# Patient Record
Sex: Female | Born: 1945 | Race: White | Hispanic: No | Smoking: Never smoker
Health system: Southern US, Community
[De-identification: ages and names within clinical notes are randomized; demographics above are authoritative.]

## PROBLEM LIST (undated history)

## (undated) DIAGNOSIS — G709 Myoneural disorder, unspecified: Secondary | ICD-10-CM

## (undated) DIAGNOSIS — E7800 Pure hypercholesterolemia, unspecified: Secondary | ICD-10-CM

## (undated) DIAGNOSIS — R0600 Dyspnea, unspecified: Secondary | ICD-10-CM

## (undated) DIAGNOSIS — G4730 Sleep apnea, unspecified: Secondary | ICD-10-CM

## (undated) DIAGNOSIS — F419 Anxiety disorder, unspecified: Secondary | ICD-10-CM

## (undated) DIAGNOSIS — Z9889 Other specified postprocedural states: Secondary | ICD-10-CM

## (undated) DIAGNOSIS — R51 Headache: Secondary | ICD-10-CM

## (undated) DIAGNOSIS — B0229 Other postherpetic nervous system involvement: Secondary | ICD-10-CM

## (undated) DIAGNOSIS — B029 Zoster without complications: Secondary | ICD-10-CM

## (undated) DIAGNOSIS — D649 Anemia, unspecified: Secondary | ICD-10-CM

## (undated) DIAGNOSIS — R519 Headache, unspecified: Secondary | ICD-10-CM

## (undated) DIAGNOSIS — M5030 Other cervical disc degeneration, unspecified cervical region: Secondary | ICD-10-CM

## (undated) DIAGNOSIS — I10 Essential (primary) hypertension: Secondary | ICD-10-CM

## (undated) DIAGNOSIS — I2510 Atherosclerotic heart disease of native coronary artery without angina pectoris: Secondary | ICD-10-CM

## (undated) DIAGNOSIS — J329 Chronic sinusitis, unspecified: Secondary | ICD-10-CM

## (undated) DIAGNOSIS — R001 Bradycardia, unspecified: Secondary | ICD-10-CM

## (undated) DIAGNOSIS — N189 Chronic kidney disease, unspecified: Secondary | ICD-10-CM

## (undated) DIAGNOSIS — R0981 Nasal congestion: Secondary | ICD-10-CM

## (undated) DIAGNOSIS — K219 Gastro-esophageal reflux disease without esophagitis: Secondary | ICD-10-CM

## (undated) DIAGNOSIS — R112 Nausea with vomiting, unspecified: Secondary | ICD-10-CM

## (undated) DIAGNOSIS — K589 Irritable bowel syndrome without diarrhea: Secondary | ICD-10-CM

## (undated) DIAGNOSIS — T884XXA Failed or difficult intubation, initial encounter: Secondary | ICD-10-CM

## (undated) HISTORY — PX: TUBAL LIGATION: SHX77

## (undated) HISTORY — PX: CHOLECYSTECTOMY: SHX55

## (undated) HISTORY — PX: TONSILLECTOMY: SUR1361

## (undated) HISTORY — PX: UVULOPALATOPHARYNGOPLASTY: SHX827

## (undated) HISTORY — PX: ABDOMINAL HYSTERECTOMY: SHX81

## (undated) HISTORY — PX: BREAST BIOPSY: SHX20

---

## 1999-01-27 ENCOUNTER — Ambulatory Visit: Admission: RE | Admit: 1999-01-27 | Discharge: 1999-01-27 | Payer: Self-pay | Admitting: Internal Medicine

## 2004-09-18 ENCOUNTER — Ambulatory Visit: Payer: Self-pay | Admitting: Podiatry

## 2005-05-11 ENCOUNTER — Ambulatory Visit: Payer: Self-pay | Admitting: Internal Medicine

## 2005-11-11 ENCOUNTER — Ambulatory Visit: Payer: Self-pay | Admitting: Internal Medicine

## 2006-01-13 ENCOUNTER — Ambulatory Visit: Payer: Self-pay | Admitting: Internal Medicine

## 2006-05-12 ENCOUNTER — Ambulatory Visit: Payer: Self-pay | Admitting: Unknown Physician Specialty

## 2006-07-04 ENCOUNTER — Ambulatory Visit: Payer: Self-pay | Admitting: Internal Medicine

## 2006-10-03 ENCOUNTER — Ambulatory Visit: Payer: Self-pay | Admitting: Pain Medicine

## 2007-08-15 ENCOUNTER — Ambulatory Visit: Payer: Self-pay | Admitting: Internal Medicine

## 2008-01-05 ENCOUNTER — Ambulatory Visit: Payer: Self-pay | Admitting: Unknown Physician Specialty

## 2008-08-16 ENCOUNTER — Ambulatory Visit: Payer: Self-pay | Admitting: Internal Medicine

## 2009-01-14 ENCOUNTER — Ambulatory Visit: Payer: Self-pay | Admitting: Internal Medicine

## 2009-08-18 ENCOUNTER — Ambulatory Visit: Payer: Self-pay | Admitting: Internal Medicine

## 2009-12-30 ENCOUNTER — Ambulatory Visit: Payer: Self-pay | Admitting: Orthopedic Surgery

## 2010-01-06 ENCOUNTER — Ambulatory Visit: Payer: Self-pay | Admitting: Orthopedic Surgery

## 2010-04-30 ENCOUNTER — Ambulatory Visit: Payer: Self-pay | Admitting: Orthopedic Surgery

## 2010-09-02 ENCOUNTER — Ambulatory Visit: Payer: Self-pay | Admitting: Internal Medicine

## 2011-09-22 ENCOUNTER — Ambulatory Visit: Payer: Self-pay | Admitting: Internal Medicine

## 2012-02-24 ENCOUNTER — Ambulatory Visit: Payer: Self-pay | Admitting: Internal Medicine

## 2012-04-17 ENCOUNTER — Ambulatory Visit: Payer: Self-pay | Admitting: Orthopedic Surgery

## 2012-05-30 ENCOUNTER — Ambulatory Visit: Payer: Self-pay | Admitting: Orthopedic Surgery

## 2012-06-06 ENCOUNTER — Ambulatory Visit: Payer: Self-pay | Admitting: Orthopedic Surgery

## 2012-09-22 ENCOUNTER — Ambulatory Visit: Payer: Self-pay | Admitting: Internal Medicine

## 2013-06-04 ENCOUNTER — Ambulatory Visit: Payer: Self-pay | Admitting: Internal Medicine

## 2013-06-12 ENCOUNTER — Ambulatory Visit: Payer: Self-pay | Admitting: Orthopedic Surgery

## 2013-11-09 ENCOUNTER — Ambulatory Visit: Payer: Self-pay | Admitting: Internal Medicine

## 2013-11-28 ENCOUNTER — Ambulatory Visit: Payer: Self-pay | Admitting: Internal Medicine

## 2014-01-22 ENCOUNTER — Ambulatory Visit: Payer: Self-pay | Admitting: Otolaryngology

## 2014-11-25 ENCOUNTER — Other Ambulatory Visit: Payer: Self-pay | Admitting: Internal Medicine

## 2014-11-25 DIAGNOSIS — Z1231 Encounter for screening mammogram for malignant neoplasm of breast: Secondary | ICD-10-CM

## 2014-12-02 ENCOUNTER — Ambulatory Visit
Admission: RE | Admit: 2014-12-02 | Discharge: 2014-12-02 | Disposition: A | Discharge status: Home / Self Care | Payer: Managed Care, Other (non HMO) | Source: Ambulatory Visit | Attending: Internal Medicine | Admitting: Internal Medicine

## 2014-12-02 DIAGNOSIS — Z1231 Encounter for screening mammogram for malignant neoplasm of breast: Secondary | ICD-10-CM | POA: Insufficient documentation

## 2015-02-10 ENCOUNTER — Encounter: Payer: Self-pay | Admitting: *Deleted

## 2015-02-17 ENCOUNTER — Other Ambulatory Visit: Payer: Self-pay | Admitting: Orthopedic Surgery

## 2015-02-17 DIAGNOSIS — M7541 Impingement syndrome of right shoulder: Secondary | ICD-10-CM

## 2015-02-18 ENCOUNTER — Encounter: Payer: Self-pay | Admitting: Anesthesiology

## 2015-02-18 ENCOUNTER — Ambulatory Visit: Payer: Medicare Other | Admitting: Anesthesiology

## 2015-02-18 ENCOUNTER — Encounter
Admission: RE | Disposition: A | Discharge status: Home / Self Care | Payer: Self-pay | Source: Ambulatory Visit | Attending: Otolaryngology

## 2015-02-18 ENCOUNTER — Ambulatory Visit
Admission: RE | Admit: 2015-02-18 | Discharge: 2015-02-18 | Disposition: A | Discharge status: Home / Self Care | Payer: Medicare Other | Source: Ambulatory Visit | Attending: Otolaryngology | Admitting: Otolaryngology

## 2015-02-18 DIAGNOSIS — K219 Gastro-esophageal reflux disease without esophagitis: Secondary | ICD-10-CM | POA: Diagnosis not present

## 2015-02-18 DIAGNOSIS — Z8249 Family history of ischemic heart disease and other diseases of the circulatory system: Secondary | ICD-10-CM | POA: Diagnosis not present

## 2015-02-18 DIAGNOSIS — J323 Chronic sphenoidal sinusitis: Secondary | ICD-10-CM | POA: Insufficient documentation

## 2015-02-18 DIAGNOSIS — J320 Chronic maxillary sinusitis: Secondary | ICD-10-CM | POA: Diagnosis not present

## 2015-02-18 DIAGNOSIS — J339 Nasal polyp, unspecified: Secondary | ICD-10-CM | POA: Insufficient documentation

## 2015-02-18 DIAGNOSIS — R51 Headache: Secondary | ICD-10-CM | POA: Insufficient documentation

## 2015-02-18 DIAGNOSIS — J321 Chronic frontal sinusitis: Secondary | ICD-10-CM | POA: Diagnosis not present

## 2015-02-18 DIAGNOSIS — J322 Chronic ethmoidal sinusitis: Secondary | ICD-10-CM

## 2015-02-18 DIAGNOSIS — J329 Chronic sinusitis, unspecified: Secondary | ICD-10-CM | POA: Diagnosis present

## 2015-02-18 DIAGNOSIS — Z882 Allergy status to sulfonamides status: Secondary | ICD-10-CM | POA: Insufficient documentation

## 2015-02-18 DIAGNOSIS — Z79899 Other long term (current) drug therapy: Secondary | ICD-10-CM | POA: Insufficient documentation

## 2015-02-18 HISTORY — DX: Nasal congestion: R09.81

## 2015-02-18 HISTORY — PX: SPHENOIDECTOMY: SHX2421

## 2015-02-18 HISTORY — PX: MAXILLARY ANTROSTOMY: SHX2003

## 2015-02-18 HISTORY — DX: Other specified postprocedural states: Z98.890

## 2015-02-18 HISTORY — DX: Headache, unspecified: R51.9

## 2015-02-18 HISTORY — PX: FRONTAL SINUS EXPLORATION: SHX6591

## 2015-02-18 HISTORY — DX: Myoneural disorder, unspecified: G70.9

## 2015-02-18 HISTORY — DX: Gastro-esophageal reflux disease without esophagitis: K21.9

## 2015-02-18 HISTORY — PX: IMAGE GUIDED SINUS SURGERY: SHX6570

## 2015-02-18 HISTORY — DX: Headache: R51

## 2015-02-18 HISTORY — DX: Nausea with vomiting, unspecified: R11.2

## 2015-02-18 HISTORY — DX: Zoster without complications: B02.9

## 2015-02-18 HISTORY — DX: Other postherpetic nervous system involvement: B02.29

## 2015-02-18 HISTORY — DX: Sleep apnea, unspecified: G47.30

## 2015-02-18 HISTORY — PX: ETHMOIDECTOMY: SHX5197

## 2015-02-18 SURGERY — SINUS SURGERY, WITH IMAGING GUIDANCE
Anesthesia: General | Wound class: Clean Contaminated

## 2015-02-18 MED ORDER — FENTANYL CITRATE (PF) 100 MCG/2ML IJ SOLN: 25.0000 ug | INTRAMUSCULAR | Status: DC | PRN

## 2015-02-18 MED ORDER — MIDAZOLAM HCL 5 MG/5ML IJ SOLN
INTRAMUSCULAR | Status: DC | PRN
  Administered 2015-02-18: 2 mg via INTRAVENOUS

## 2015-02-18 MED ORDER — LIDOCAINE HCL (CARDIAC) 20 MG/ML IV SOLN
INTRAVENOUS | Status: DC | PRN
  Administered 2015-02-18: 50 mg via INTRAVENOUS

## 2015-02-18 MED ORDER — EPHEDRINE SULFATE 50 MG/ML IJ SOLN
INTRAMUSCULAR | Status: DC | PRN
  Administered 2015-02-18: 10 mg via INTRAVENOUS

## 2015-02-18 MED ORDER — CEFPROZIL 500 MG PO TABS: 500.0000 mg | ORAL_TABLET | Freq: Two times a day (BID) | ORAL | Status: DC

## 2015-02-18 MED ORDER — ACETAMINOPHEN 10 MG/ML IV SOLN
INTRAVENOUS | Status: DC | PRN
  Administered 2015-02-18: 1000 mg via INTRAVENOUS

## 2015-02-18 MED ORDER — OXYMETAZOLINE HCL 0.05 % NA SOLN
NASAL | Status: DC | PRN
  Administered 2015-02-18: 1 via TOPICAL

## 2015-02-18 MED ORDER — GLYCOPYRROLATE 0.2 MG/ML IJ SOLN
INTRAMUSCULAR | Status: DC | PRN
  Administered 2015-02-18: 0.1 mg via INTRAVENOUS

## 2015-02-18 MED ORDER — LIDOCAINE-EPINEPHRINE 1 %-1:100000 IJ SOLN
INTRAMUSCULAR | Status: DC | PRN
  Administered 2015-02-18: 7 mL

## 2015-02-18 MED ORDER — SCOPOLAMINE 1 MG/3DAYS TD PT72
1.0000 | MEDICATED_PATCH | TRANSDERMAL | Status: DC
  Administered 2015-02-18: 1.5 mg via TRANSDERMAL

## 2015-02-18 MED ORDER — HYDROCODONE-ACETAMINOPHEN 5-325 MG PO TABS: 1.0000 | ORAL_TABLET | ORAL | Status: DC | PRN

## 2015-02-18 MED ORDER — ROCURONIUM BROMIDE 100 MG/10ML IV SOLN
INTRAVENOUS | Status: DC | PRN
  Administered 2015-02-18: 20 mg via INTRAVENOUS

## 2015-02-18 MED ORDER — PROPOFOL 10 MG/ML IV BOLUS
INTRAVENOUS | Status: DC | PRN
  Administered 2015-02-18: 120 mg via INTRAVENOUS

## 2015-02-18 MED ORDER — PREDNISONE 10 MG (48) PO TBPK: ORAL_TABLET | ORAL | Status: DC

## 2015-02-18 MED ORDER — ONDANSETRON HCL 4 MG/2ML IJ SOLN
INTRAMUSCULAR | Status: DC | PRN
  Administered 2015-02-18: 4 mg via INTRAVENOUS

## 2015-02-18 MED ORDER — FENTANYL CITRATE (PF) 100 MCG/2ML IJ SOLN
INTRAMUSCULAR | Status: DC | PRN
  Administered 2015-02-18: 25 ug via INTRAVENOUS
  Administered 2015-02-18: 100 ug via INTRAVENOUS
  Administered 2015-02-18 (×3): 25 ug via INTRAVENOUS

## 2015-02-18 MED ORDER — PROMETHAZINE HCL 25 MG/ML IJ SOLN
6.2500 mg | INTRAMUSCULAR | Status: DC | PRN
  Administered 2015-02-18: 6.25 mg via INTRAVENOUS

## 2015-02-18 MED ORDER — LACTATED RINGERS IV SOLN
INTRAVENOUS | Status: DC
  Administered 2015-02-18 (×2): via INTRAVENOUS

## 2015-02-18 MED ORDER — DEXAMETHASONE SODIUM PHOSPHATE 4 MG/ML IJ SOLN
INTRAMUSCULAR | Status: DC | PRN
  Administered 2015-02-18: 10 mg via INTRAVENOUS

## 2015-02-18 MED ORDER — HYDROCODONE-ACETAMINOPHEN 7.5-325 MG PO TABS: 1.0000 | ORAL_TABLET | Freq: Once | ORAL | Status: DC | PRN

## 2015-02-18 SURGICAL SUPPLY — 28 items
APL SRG 38 LTWT LNG FL B (MISCELLANEOUS) ×3
APPLICATOR ARISTA FLEXITIP XL (MISCELLANEOUS) ×2 IMPLANT
BATTERY INSTRU NAVIGATION (MISCELLANEOUS) ×20 IMPLANT
BLADE IRRIGATOR 40D CVD (IRRIGATION / IRRIGATOR) IMPLANT
BTRY SRG DRVR LF (MISCELLANEOUS) ×12
CANISTER SUCT 1200ML W/VALVE (MISCELLANEOUS) ×5 IMPLANT
COAG SUCT 10F 3.5MM HAND CTRL (MISCELLANEOUS) ×5 IMPLANT
DEVICE INFLATION SEID (MISCELLANEOUS) IMPLANT
DRAPE HEAD BAR (DRAPES) ×5 IMPLANT
DRESSING NASL FOAM PST OP SINU (MISCELLANEOUS) IMPLANT
DRSG NASAL 4CM NASOPORE (MISCELLANEOUS) IMPLANT
DRSG NASAL FOAM POST OP SINU (MISCELLANEOUS) ×10
GLOVE BIO SURGEON STRL SZ7.5 (GLOVE) ×10 IMPLANT
IRRIGATOR 4MM STR (IRRIGATION / IRRIGATOR) ×5 IMPLANT
IV NS 500ML (IV SOLUTION) ×5
IV NS 500ML BAXH (IV SOLUTION) ×3 IMPLANT
NAVIGATION MASK REG  ST (MISCELLANEOUS) ×5 IMPLANT
NS IRRIG 500ML POUR BTL (IV SOLUTION) ×5 IMPLANT
PACK DRAPE NASAL/ENT (PACKS) ×5 IMPLANT
PACKING NASAL EPIS 4X2.4 XEROG (MISCELLANEOUS) IMPLANT
PAD GROUND ADULT SPLIT (MISCELLANEOUS) ×5 IMPLANT
PATTIES SURGICAL .5 X3 (DISPOSABLE) ×5 IMPLANT
SET HANDPIECE IRR DIEGO (MISCELLANEOUS) ×5 IMPLANT
SOL ANTI-FOG 6CC FOG-OUT (MISCELLANEOUS) ×3 IMPLANT
SOL FOG-OUT ANTI-FOG 6CC (MISCELLANEOUS) ×2
SUT CHROMIC 4 0 SH 27 (SUTURE) ×2 IMPLANT
SYRINGE 10CC LL (SYRINGE) ×5 IMPLANT
WATER STERILE IRR 500ML POUR (IV SOLUTION) ×2 IMPLANT

## 2015-02-18 NOTE — H&P (Signed)
History and physical reviewed and will be scanned in later. No change in medical status reported by the patient or family, appears stable for surgery. All questions regarding the procedure answered, and patient (or family if a child) expressed understanding of the procedure.  Beth Beck S @TODAY@ 

## 2015-02-18 NOTE — Anesthesia Postprocedure Evaluation (Signed)
  Anesthesia Post-op Note  Patient: Beth Beck  Procedure(s) Performed: Procedure(s) with comments: IMAGE GUIDED SINUS SURGERY (N/A) - GAVE DISK TO CE CE REVISION BILATERAL SPHENOIDECTOMY (Bilateral) BILATERAL TOTAL ETHMOIDECTOMY (Bilateral) BILATERAL FRONTAL RECESS SINUS EXPLORATION (Bilateral) MAXILLARY ANTROSTOMY right side with tissue removal.  Anesthesia type:General ETT  Patient location: PACU  Post pain: Pain level controlled  Post assessment: Post-op Vital signs reviewed, Patient's Cardiovascular Status Stable, Respiratory Function Stable, Patent Airway and No signs of Nausea or vomiting  Post vital signs: Reviewed and stable  Last Vitals:  Filed Vitals:   02/18/15 1445  BP: 162/74  Pulse: 74  Temp:   Resp: 15    Level of consciousness: awake, alert  and patient cooperative  Complications: No apparent anesthesia complications

## 2015-02-18 NOTE — Discharge Instructions (Signed)
[facility] °[facility] °ENDOSCOPIC SINUS SURGERY °[facility] ° °What is Functional Endoscopic Sinus Surgery? ° The Surgery involves making the natural openings of the sinuses larger by removing the bony partitions that separate the sinuses from the nasal cavity.  The natural sinus lining is preserved as much as possible to allow the sinuses to resume normal function after the surgery.  In some patients nasal polyps (excessively swollen lining of the sinuses) may be removed to relieve obstruction of the sinus openings.  The surgery is performed through the nose using lighted scopes, which eliminates the need for incisions on the face.  A septoplasty is a different procedure which is sometimes performed with sinus surgery.  It involves straightening the boy partition that separates the two sides of your nose.  A crooked or deviated septum may need repair if is obstructing the sinuses or nasal airflow.  Turbinate reduction is also often performed during sinus surgery.  The turbinates are bony proturberances from the side walls of the nose which swell and can obstruct the nose in patients with sinus and allergy problems.  Their size can be surgically reduced to help relieve nasal obstruction. ° °What Can Sinus Surgery Do For Me? ° Sinus surgery can reduce the frequency of sinus infections requiring antibiotic treatment.  This can provide improvement in nasal congestion, post-nasal drainage, facial pressure and nasal obstruction.  Surgery will NOT prevent you from ever having an infection again, so it usually only for patients who get infections 4 or more times yearly requiring antibiotics, or for infections that do not clear with antibiotics.  It will not cure nasal allergies, so patients with allergies may still require medication to treat their allergies after surgery. Surgery may improve headaches related to sinusitis, however, some people will continue to  require medication to control sinus headaches related to allergies.  Surgery will do nothing for other forms of headache (migraine, tension or cluster). ° °What Are the Risks of Endoscopic Sinus Surgery? ° Current techniques allow surgery to be performed safely with little risk, however, there are rare complications that patients should be aware of.  Because the sinuses are located around the eyes, there is risk of eye injury, including blindness, though again, this would be quite rare. This is usually a result of bleeding behind the eye during surgery, which puts the vision oat risk, though there are treatments to protect the vision and prevent permanent disrupted by surgery causing a leak of the spinal fluid that surrounds the brain.  More serious complications would include bleeding inside the brain cavity or damage to the brain.  Again, all of these complications are uncommon, and spinal fluid leaks can be safely managed surgically if they occur.  The most common complication of sinus surgery is bleeding from the nose, which may require packing or cauterization of the nose.  Continued sinus have polyps may experience recurrence of the polyps requiring revision surgery.  Alterations of sense of smell or injury to the tear ducts are also rare complications.  ° °What is the Surgery Like, and what is the Recovery? ° The Surgery usually takes a couple of hours to perform, and is usually performed under a general anesthetic (completely asleep).  Patients are usually discharged home after a couple of hours.  Sometimes during surgery it is necessary to pack the nose to control bleeding, and the packing is left in place for 24 - 48 hours, and removed by your surgeon.    If a septoplasty was performed during the procedure, there is often a splint placed which must be removed after 5-7 days.   °Discomfort: Pain is usually mild to moderate, and can be controlled by prescription pain medication or acetaminophen (Tylenol).   Aspirin, Ibuprofen (Advil, Motrin), or Naprosyn (Aleve) should be avoided, as they can cause increased bleeding.  Most patients feel sinus pressure like they have a bad head cold for several days.  Sleeping with your head elevated can help reduce swelling and facial pressure, as can ice packs over the face.  A humidifier may be helpful to keep the mucous and blood from drying in the nose.  ° °Diet: There are no specific diet restrictions, however, you should generally start with clear liquids and a light diet of bland foods because the anesthetic can cause some nausea.  Advance your diet depending on how your stomach feels.  Taking your pain medication with food will often help reduce stomach upset which pain medications can cause. ° °Nasal Saline Irrigation: It is important to remove blood clots and dried mucous from the nose as it is healing.  This is done by having you irrigate the nose at least 3 - 4 times daily with a salt water solution.  We recommend using NeilMed Sinus Rinse (available at the drug store).  Fill the squeeze bottle with the solution, bend over a sink, and insert the tip of the squeeze bottle into the nose ½ of an inch.  Point the tip of the squeeze bottle towards the inside corner of the eye on the same side your irrigating.  Squeeze the bottle and gently irrigate the nose.  If you bend forward as you do this, most of the fluid will flow back out of the nose, instead of down your throat.   The solution should be warm, near body temperature, when you irrigate.   Each time you irrigate, you should use a full squeeze bottle.  ° °Note that if you are instructed to use Nasal Steroid Sprays at any time after your surgery, irrigate with saline BEFORE using the steroid spray, so you do not wash it all out of the nose. °Another product, Nasal Saline Gel (such as AYR Nasal Saline Gel) can be applied in each nostril 3 - 4 times daily to moisture the nose and reduce scabbing or crusting. ° °Bleeding:   Bloody drainage from the nose can be expected for several days, and patients are instructed to irrigate their nose frequently with salt water to help remove mucous and blood clots.  The drainage may be dark red or brown, though some fresh blood may be seen intermittently, especially after irrigation.  Do not blow you nose, as bleeding may occur. If you must sneeze, keep your mouth open to allow air to escape through your mouth. ° °If heavy bleeding occurs: Irrigate the nose with saline to rinse out clots, then spray the nose 3 - 4 times with Afrin Nasal Decongestant Spray.  The spray will constrict the blood vessels to slow bleeding.  Pinch the lower half of your nose shut to apply pressure, and lay down with your head elevated.  Ice packs over the nose may help as well. If bleeding persists despite these measures, you should notify your doctor.  Do not use the Afrin routinely to control nasal congestion after surgery, as it can result in worsening congestion and may affect healing.  ° ° ° °Activity: Return to work varies among patients. Most patients will be   out of work at least 5 - 7 days to recover.  Patient may return to work after they are off of narcotic pain medication, and feeling well enough to perform the functions of their job.  Patients must avoid heavy lifting (over 10 pounds) or strenuous physical for 2 weeks after surgery, so your employer may need to assign you to light duty, or keep you out of work longer if light duty is not possible.  NOTE: you should not drive, operate dangerous machinery, do any mentally demanding tasks or make any important legal or financial decisions while on narcotic pain medication and recovering from the general anesthetic.  °  °Call Your Doctor Immediately if You Have Any of the Following: °1. Bleeding that you cannot control with the above measures °2. Loss of vision, double vision, bulging of the eye or black eyes. °3. Fever over 101 degrees °4. Neck stiffness with  severe headache, fever, nausea and change in mental state. °You are always encourage to call anytime with concerns, however, please call with requests for pain medication refills during office hours. ° °Office Endoscopy: During follow-up visits your doctor will remove any packing or splints that may have been placed and evaluate and clean your sinuses endoscopically.  Topical anesthetic will be used to make this as comfortable as possible, though you may want to take your pain medication prior to the visit.  How often this will need to be done varies from patient to patient.  After complete recovery from the surgery, you may need follow-up endoscopy from time to time, particularly if there is concern of recurrent infection or nasal polyps. ° °General Anesthesia, Adult, Care After °Refer to this sheet in the next few weeks. These instructions provide you with information on caring for yourself after your procedure. Your health care provider may also give you more specific instructions. Your treatment has been planned according to current medical practices, but problems sometimes occur. Call your health care provider if you have any problems or questions after your procedure. °WHAT TO EXPECT AFTER THE PROCEDURE °After the procedure, it is typical to experience: °· Sleepiness. °· Nausea and vomiting. °HOME CARE INSTRUCTIONS °· For the first 24 hours after general anesthesia: °¨ Have a responsible person with you. °¨ Do not drive a car. If you are alone, do not take public transportation. °¨ Do not drink alcohol. °¨ Do not take medicine that has not been prescribed by your health care provider. °¨ Do not sign important papers or make important decisions. °¨ You may resume a normal diet and activities as directed by your health care provider. °· Change bandages (dressings) as directed. °· If you have questions or problems that seem related to general anesthesia, call the hospital and ask for the anesthetist or  anesthesiologist on call. °SEEK MEDICAL CARE IF: °· You have nausea and vomiting that continue the day after anesthesia. °· You develop a rash. °SEEK IMMEDIATE MEDICAL CARE IF:  °· You have difficulty breathing. °· You have chest pain. °· You have any allergic problems. °  °This information is not intended to replace advice given to you by your health care provider. Make sure you discuss any questions you have with your health care provider. °  °Document Released: 07/26/2000 Document Revised: 05/10/2014 Document Reviewed: 08/18/2011 °Elsevier Interactive Patient Education ©2016 [employer] ° °

## 2015-02-18 NOTE — Op Note (Signed)
02/18/2015  1:58 PM    [place] [facility]  [id number]   Pre-Op Diagnosis:  CHRONIC SINUSITIS SPHENOIDS ETHMOIDS, MAXILLARY AND FRONTALS, NASAL POLYPS Post-op Diagnosis: SAME   Procedure:  1)  Image Guided Sinus Surgery,   2)  Right Endoscopic Maxillary Antrostomy with Tissue Removal   3)  Bilateral Frontal Sinusotomy   4)  Bilateral total ethmoidectomy   5)  Bilateral Sphenoidotomy    Surgeon:  Sandi Mealy  Anesthesia:  General endotracheal  EBL:  100cc  Complications:  None  Findings: Lateralization of the middle turbinate bilaterally, polypoid changes were noted in both ethmoid sinuses and frontal recesses. There was polypoid change in the sphenoid recess. The maxillary sinuses, sphenoid sinuses, ethmoid and frontal sinuses were all noted to contain material consistent with fungal mucin.  Procedure: After the patient was identified in holding and the benefits of the procedure were reviewed as well as the consent and risks, the patient was taken to the operating room and with the patient in a comfortable supine position,  general orotracheal anesthesia was induced without difficulty.  A proper time-out was performed.  The Stryker image guidance system was set up and calibrated in the normal fashion and felt to be acceptable.  Next 1% Xylocaine with 1:100,000 epinephrine was infiltrated into the inferior turbinates, septum, and anterior middle turbinates bilaterally.  Several minutes were allowed for this to take effect.  Cottoniod pledgets soaked in Afrin were placed into both nasal cavities and left while the patient was prepped and draped in the standard fashion. The image guided suction was calibrated and used to inspect known points in the nasal cavity to assess accuracy of the image guided system. Accuracy was felt to be good.   The left middle turbinate was medialized and  polyps were debrided from the middle meatus utilizing then debrider. some residual ethmoid air cells  were opened further and polypoid tissue was also debrided from the lateral aspect of the middle turbinate. A small amount of polypoid material was debrided from around the left maxillary antrostomy which was predominantly patent. Cultures were taken from the middle meatus where there was fungal appearing debris located. The left maxillary antrostomy was repeatedly irrigated with saline to remove thick fungal-like debris from the maxillary sinus. This required an extended period of irrigation to get all of this clear as some of it was stuck within the recesses of the maxillary sinus. Attention was turned back to the ethmoids working more posteriorly, infrequently utilizing endoscopic Stryker suction to reassess the anatomy and make sure the ethmoids were widely patent. Attention was turned medial to the middle turbinate and the sphenoid recess inspected. Polyps were debrided from this area with the microdebrider. The sphenoid was then entered inferomedially around the sphenoid os with the suction and a Kerrison then used to widely open the sphenoid os. The sphenoid was then vigorously irrigated with saline to remove thick material. Attention was then turned to the frontal sinus. Polyps were debrided from the frontal recess until the frontal os was visualized. This was actually relatively open after removal of the polyps. There was some thick debris in the medial aspect of the frontal sinus which was irrigated clear with saline.    Attention was then turned to the right side. The accuracy of the endoscopic session was reassessed and seemed to be reduced compared to the start of the case. At this point took a number of minutes to get this corrected, and a new endoscopic suction had to  be introduced, as the original suction and appeared to fall out of calibration. Once this was corrected the procedure again commenced, medializing the middle turbinate and resecting polypoid the Terrell from the middle meatus with the  microdebrider. On this side the right maxillary sinus was actually scarred down a bit and blocked with polyps. This was widely opened using a combination of microdebrider and through-cutting forceps. The sinus was then vigorously irrigated to clear debris from it. Cultures were also taken from the right middle meatus. Dissection then proceeded back into the ethmoids, opening the ethmoids further and removing polyps from the ethmoid cavities. Next attention was turned to the medial aspect of the middle turbinate and the sphenoid recess. The sphenoid os could not be identified on this side. I probed around several times and could not find an area of thin bone to safely enter the sphenoid from this approach. I returned to the lateral aspect of the middle turbinate and went into the posterior most ethmoid air cell, and reassessed the anatomy using the Stryker suction. At a safe location as medial and inferior as possible, a suction was passed through the anterior face of the sphenoid sinus and a Kerrison used to then widely open this area. The sphenoid was then vigorously irrigated with saline which took quite a bit of effort to clear all of the thick debris. Attention was then turned to the frontal recess. Curved frontal instruments and the curved frontal suction were used to dissect the frontal recess removing polyps until the region of the frontal os was identified. This was widely opened using curved frontal through cutting instruments. The frontal sinus was irrigated as well. At the completion of the procedure all of the sinuses appeared to be widely patent. A 5-0 chromic gut suture was passed through the left middle turbinate, through the septum, through the right middle turbinate, and then passed back through the septum to secure the turbinates medially to the septum so they would not lateralize.  The nose was suctioned and inspected. Stammberger absorbable sinus packing was then placed in the ethmoid cavities  bilaterally.  Bleeding was felt to be well controlled.   The patient was then returned to the anesthesiologist for awakening and taken to recovery room in good condition postoperatively.  Disposition:   PACU and d/c home  Plan: Ice, elevation, narcotic analgesia and prophylactic antibiotics and steroids. Begin sinus irrigations with saline tomorrow, irrigating 3-4 times daily. Return to the office in 7 days.  Return to work in 7-10 days, no strenuous activities for two weeks.   Sandi MealyBennett, Rolinda Impson S 02/18/2015 1:58 PM

## 2015-02-18 NOTE — Anesthesia Preprocedure Evaluation (Signed)
Anesthesia Evaluation  Patient identified by MRN, date of birth, ID band Patient awake    Reviewed: Allergy & Precautions, H&P , NPO status , Patient's Chart, lab work & pertinent test results, reviewed documented beta blocker date and time   Airway Mallampati: II  TM Distance: >3 FB Neck ROM: full    Dental no notable dental hx.    Pulmonary sleep apnea (resolved after UPPP) ,    Pulmonary exam normal breath sounds clear to auscultation       Cardiovascular Exercise Tolerance: Good negative cardio ROS   Rhythm:regular Rate:Normal     Neuro/Psych  Headaches, negative psych ROS   GI/Hepatic Neg liver ROS, GERD  ,  Endo/Other  negative endocrine ROS  Renal/GU negative Renal ROS  negative genitourinary   Musculoskeletal   Abdominal   Peds  Hematology negative hematology ROS (+)   Anesthesia Other Findings   Reproductive/Obstetrics negative OB ROS                             Anesthesia Physical Anesthesia Plan  ASA: II  Anesthesia Plan: General ETT   Post-op Pain Management:    Induction:   Airway Management Planned:   Additional Equipment:   Intra-op Plan:   Post-operative Plan:   Informed Consent: I have reviewed the patients History and Physical, chart, labs and discussed the procedure including the risks, benefits and alternatives for the proposed anesthesia with the patient or authorized representative who has indicated his/her understanding and acceptance.   Dental Advisory Given  Plan Discussed with: CRNA  Anesthesia Plan Comments:         Anesthesia Quick Evaluation

## 2015-02-18 NOTE — Anesthesia Procedure Notes (Signed)
Procedure Name: Intubation Date/Time: 02/18/2015 10:21 AM Performed by: Jimmy PicketAMYOT, Mikael Debell Pre-anesthesia Checklist: Patient identified, Emergency Drugs available, Suction available, Patient being monitored and Timeout performed Patient Re-evaluated:Patient Re-evaluated prior to inductionOxygen Delivery Method: Circle system utilized Preoxygenation: Pre-oxygenation with 100% oxygen Intubation Type: IV induction Ventilation: Mask ventilation without difficulty Laryngoscope Size: Miller and 2 Grade View: Grade II Tube type: Oral Rae Tube size: 7.0 mm Number of attempts: 1 Airway Equipment and Method: Bougie stylet Placement Confirmation: ETT inserted through vocal cords under direct vision,  positive ETCO2 and breath sounds checked- equal and bilateral Tube secured with: Tape Dental Injury: Teeth and Oropharynx as per pre-operative assessment

## 2015-02-18 NOTE — Transfer of Care (Signed)
Immediate Anesthesia Transfer of Care Note  Patient: Beth Beck  Procedure(s) Performed: Procedure(s) with comments: IMAGE GUIDED SINUS SURGERY (N/A) - GAVE DISK TO CE CE REVISION BILATERAL SPHENOIDECTOMY (Bilateral) BILATERAL TOTAL ETHMOIDECTOMY (Bilateral) BILATERAL FRONTAL RECESS SINUS EXPLORATION (Bilateral) MAXILLARY ANTROSTOMY right side with tissue removal.  Patient Location: PACU  Anesthesia Type: General ETT  Level of Consciousness: awake, alert  and patient cooperative  Airway and Oxygen Therapy: Patient Spontanous Breathing and Patient connected to supplemental oxygen  Post-op Assessment: Post-op Vital signs reviewed, Patient's Cardiovascular Status Stable, Respiratory Function Stable, Patent Airway and No signs of Nausea or vomiting  Post-op Vital Signs: Reviewed and stable  Complications: No apparent anesthesia complications

## 2015-02-19 ENCOUNTER — Encounter: Payer: Self-pay | Admitting: Otolaryngology

## 2015-02-20 LAB — SURGICAL PATHOLOGY

## 2015-02-21 ENCOUNTER — Ambulatory Visit
Admission: RE | Admit: 2015-02-21 | Discharge: 2015-02-21 | Disposition: A | Discharge status: Home / Self Care | Payer: Medicare Other | Source: Ambulatory Visit | Attending: Orthopedic Surgery | Admitting: Orthopedic Surgery

## 2015-02-21 DIAGNOSIS — M7551 Bursitis of right shoulder: Secondary | ICD-10-CM | POA: Diagnosis not present

## 2015-02-21 DIAGNOSIS — X58XXXA Exposure to other specified factors, initial encounter: Secondary | ICD-10-CM | POA: Diagnosis not present

## 2015-02-21 DIAGNOSIS — S43431A Superior glenoid labrum lesion of right shoulder, initial encounter: Secondary | ICD-10-CM | POA: Diagnosis not present

## 2015-02-21 DIAGNOSIS — M7541 Impingement syndrome of right shoulder: Secondary | ICD-10-CM | POA: Diagnosis not present

## 2015-05-08 ENCOUNTER — Encounter
Admission: RE | Admit: 2015-05-08 | Discharge: 2015-05-08 | Disposition: A | Discharge status: Home / Self Care | Payer: Medicare Other | Source: Ambulatory Visit | Attending: Surgery | Admitting: Surgery

## 2015-05-08 DIAGNOSIS — Z01812 Encounter for preprocedural laboratory examination: Secondary | ICD-10-CM | POA: Insufficient documentation

## 2015-05-08 DIAGNOSIS — Z01810 Encounter for preprocedural cardiovascular examination: Secondary | ICD-10-CM | POA: Insufficient documentation

## 2015-05-08 HISTORY — DX: Anxiety disorder, unspecified: F41.9

## 2015-05-08 HISTORY — DX: Chronic kidney disease, unspecified: N18.9

## 2015-05-08 HISTORY — DX: Irritable bowel syndrome, unspecified: K58.9

## 2015-05-08 LAB — DIFFERENTIAL
Basophils Absolute: 0 10*3/uL (ref 0–0.1)
Basophils Relative: 1 %
EOS ABS: 0.3 10*3/uL (ref 0–0.7)
EOS PCT: 6 %
LYMPHS ABS: 1.4 10*3/uL (ref 1.0–3.6)
Lymphocytes Relative: 28 %
Monocytes Absolute: 0.6 10*3/uL (ref 0.2–0.9)
Monocytes Relative: 11 %
NEUTROS PCT: 54 %
Neutro Abs: 2.8 10*3/uL (ref 1.4–6.5)

## 2015-05-08 LAB — CBC
HCT: 37.2 % (ref 35.0–47.0)
Hemoglobin: 12.2 g/dL (ref 12.0–16.0)
MCH: 28.1 pg (ref 26.0–34.0)
MCHC: 32.9 g/dL (ref 32.0–36.0)
MCV: 85.4 fL (ref 80.0–100.0)
PLATELETS: 224 10*3/uL (ref 150–440)
RBC: 4.36 MIL/uL (ref 3.80–5.20)
RDW: 14.4 % (ref 11.5–14.5)
WBC: 5.1 10*3/uL (ref 3.6–11.0)

## 2015-05-08 NOTE — Patient Instructions (Signed)
  Your procedure is scheduled on: May 15, 2015 (Thursday) Report to Day Surgery.(Medical Mall)Second Floor To find out your arrival time please call [phone] between 1PM - 3PM on May 13, 2105 (Wednesday).  Remember: Instructions that are not followed completely may result in serious medical risk, up to and including death, or upon the discretion of your surgeon and anesthesiologist your surgery may need to be rescheduled.    __x__ 1. Do not eat food or drink liquids after midnight. No gum chewing or hard candies.     ____ 2. No Alcohol for 24 hours before or after surgery.   ____ 3. Bring all medications with you on the day of surgery if instructed.    __x__ 4. Notify your doctor if there is any change in your medical condition     (cold, fever, infections).     Do not wear jewelry, make-up, hairpins, clips or nail polish.  Do not wear lotions, powders, or perfumes. You may wear deodorant.  Do not shave 48 hours prior to surgery. Men may shave face and neck.  Do not bring valuables to the hospital.    [facility] not responsible for any belongings or valuables.               Contacts, dentures or bridgework may not be worn into surgery.  Leave your suitcase in the car. After surgery it may be brought to your room.  For patients admitted to the hospital, discharge time is determined by your                treatment team.   Patients discharged the day of surgery will not be allowed to drive home.   Please read over the following fact sheets that you were given:   Surgical Site Infection Prevention   ____ Take these medicines the morning of surgery with A SIP OF WATER:    1. Nexium (Nexium at bedtime on January 11)  2.  Gabapentin  3.   4.  5.  6.  ____ Fleet Enema (as directed)   __x__ Use CHG Soap as directed  ____ Use inhalers on the day of surgery  ____ Stop metformin 2 days prior to surgery    ____ Take 1/2 of usual insulin dose the night before  surgery and none on the morning of surgery.   __x__ Stop Coumadin/Plavix/aspirin on (Patient has stopped Aspirin)  __x__ Stop Anti-inflammatories on (Stop Naproxen now) Tylenol ok to take for pain if needed)   __x__ Stop supplements until after surgery.  (Stop Black Cohosh and Probiotic now)  ____ Bring C-Pap to the hospital.

## 2015-05-15 ENCOUNTER — Encounter: Admission: RE | Payer: Self-pay | Source: Ambulatory Visit

## 2015-05-15 ENCOUNTER — Ambulatory Visit: Admission: RE | Admit: 2015-05-15 | Payer: Medicare Other | Source: Ambulatory Visit | Admitting: Surgery

## 2015-05-15 SURGERY — ARTHROSCOPY, SHOULDER WITH REPAIR, ROTATOR CUFF, OPEN
Anesthesia: Choice | Laterality: Right

## 2015-05-23 HISTORY — PX: NASAL SINUS SURGERY: SHX719

## 2015-07-22 ENCOUNTER — Encounter
Admission: RE | Admit: 2015-07-22 | Discharge: 2015-07-22 | Disposition: A | Discharge status: Home / Self Care | Payer: Medicare Other | Source: Ambulatory Visit | Attending: Surgery | Admitting: Surgery

## 2015-07-22 DIAGNOSIS — Z01812 Encounter for preprocedural laboratory examination: Secondary | ICD-10-CM | POA: Insufficient documentation

## 2015-07-22 HISTORY — DX: Anemia, unspecified: D64.9

## 2015-07-22 LAB — SURGICAL PCR SCREEN
MRSA, PCR: NEGATIVE
Staphylococcus aureus: NEGATIVE

## 2015-07-22 NOTE — Patient Instructions (Signed)
  Your procedure is scheduled on: July 29, 2015 (Tuesday) Report to Day Surgery.[facility] Mall) Second Floor To find out your arrival time please call [phone] between 1PM - 3PM on July 28, 2015 (Monday).  Remember: Instructions that are not followed completely may result in serious medical risk, up to and including death, or upon the discretion of your surgeon and anesthesiologist your surgery may need to be rescheduled.    __x__ 1. Do not eat food or drink liquids after midnight. No gum chewing or hard candies.     __x__ 2. No Alcohol for 24 hours before or after surgery.   ____ 3. Bring all medications with you on the day of surgery if instructed.    __x__ 4. Notify your doctor if there is any change in your medical condition     (cold, fever, infections).     Do not wear jewelry, make-up, hairpins, clips or nail polish.  Do not wear lotions, powders, or perfumes. You may wear deodorant.  Do not shave 48 hours prior to surgery. Men may shave face and neck.  Do not bring valuables to the hospital.    [facility] any belongings or valuables.               Contacts, dentures or bridgework may not be worn into surgery.  Leave your suitcase in the car. After surgery it may be brought to your room.  For patients admitted to the hospital, discharge time is determined by your                treatment team.   Patients discharged the day of surgery will not be allowed to drive home.   Please read over the following fact sheets that you were given:   MRSA Information and Surgical Site Infection Prevention   ____ Take these medicines the morning of surgery with A SIP OF WATER:    1. Esomeprazole (Esomeprazole at bedtime on March 27)  2. Gabapentin  3.   4.  5.  6.  ____ Fleet Enema (as directed)   ____ Use CHG Soap as directed (Has from previous pre-op visit)  ____ Use inhalers on the day of surgery  ____ Stop metformin 2 days prior to  surgery    ____ Take 1/2 of usual insulin dose the night before surgery and none on the morning of surgery.   __x__ Stop Coumadin/Plavix/aspirin on (Patient has stopped Aspirin)  __x__ Stop Anti-inflammatories on (Stop Naproxen one week prior to surgery)   __x__ Stop supplements until after surgery.  (Stop ALL supplements now)  ____ Bring C-Pap to the hospital.

## 2015-07-29 ENCOUNTER — Encounter: Payer: Self-pay | Admitting: *Deleted

## 2015-07-29 ENCOUNTER — Ambulatory Visit
Admission: RE | Admit: 2015-07-29 | Discharge: 2015-07-29 | Disposition: A | Discharge status: Home / Self Care | Payer: Medicare Other | Source: Ambulatory Visit | Attending: Surgery | Admitting: Surgery

## 2015-07-29 ENCOUNTER — Ambulatory Visit: Payer: Medicare Other | Admitting: Anesthesiology

## 2015-07-29 ENCOUNTER — Encounter
Admission: RE | Disposition: A | Discharge status: Home / Self Care | Payer: Self-pay | Source: Ambulatory Visit | Attending: Surgery

## 2015-07-29 DIAGNOSIS — Z809 Family history of malignant neoplasm, unspecified: Secondary | ICD-10-CM | POA: Insufficient documentation

## 2015-07-29 DIAGNOSIS — M7581 Other shoulder lesions, right shoulder: Secondary | ICD-10-CM | POA: Insufficient documentation

## 2015-07-29 DIAGNOSIS — Z888 Allergy status to other drugs, medicaments and biological substances status: Secondary | ICD-10-CM | POA: Insufficient documentation

## 2015-07-29 DIAGNOSIS — M94211 Chondromalacia, right shoulder: Secondary | ICD-10-CM | POA: Diagnosis not present

## 2015-07-29 DIAGNOSIS — G43909 Migraine, unspecified, not intractable, without status migrainosus: Secondary | ICD-10-CM | POA: Diagnosis not present

## 2015-07-29 DIAGNOSIS — Z8249 Family history of ischemic heart disease and other diseases of the circulatory system: Secondary | ICD-10-CM | POA: Insufficient documentation

## 2015-07-29 DIAGNOSIS — M25511 Pain in right shoulder: Secondary | ICD-10-CM | POA: Diagnosis not present

## 2015-07-29 DIAGNOSIS — K219 Gastro-esophageal reflux disease without esophagitis: Secondary | ICD-10-CM | POA: Diagnosis not present

## 2015-07-29 DIAGNOSIS — Z881 Allergy status to other antibiotic agents status: Secondary | ICD-10-CM | POA: Diagnosis not present

## 2015-07-29 DIAGNOSIS — G4730 Sleep apnea, unspecified: Secondary | ICD-10-CM | POA: Insufficient documentation

## 2015-07-29 DIAGNOSIS — B0229 Other postherpetic nervous system involvement: Secondary | ICD-10-CM | POA: Insufficient documentation

## 2015-07-29 DIAGNOSIS — Z9049 Acquired absence of other specified parts of digestive tract: Secondary | ICD-10-CM | POA: Insufficient documentation

## 2015-07-29 DIAGNOSIS — D649 Anemia, unspecified: Secondary | ICD-10-CM | POA: Insufficient documentation

## 2015-07-29 DIAGNOSIS — K589 Irritable bowel syndrome without diarrhea: Secondary | ICD-10-CM | POA: Diagnosis not present

## 2015-07-29 DIAGNOSIS — Z79899 Other long term (current) drug therapy: Secondary | ICD-10-CM | POA: Insufficient documentation

## 2015-07-29 DIAGNOSIS — M7541 Impingement syndrome of right shoulder: Secondary | ICD-10-CM | POA: Insufficient documentation

## 2015-07-29 DIAGNOSIS — Z882 Allergy status to sulfonamides status: Secondary | ICD-10-CM | POA: Diagnosis not present

## 2015-07-29 DIAGNOSIS — E785 Hyperlipidemia, unspecified: Secondary | ICD-10-CM | POA: Insufficient documentation

## 2015-07-29 DIAGNOSIS — F419 Anxiety disorder, unspecified: Secondary | ICD-10-CM | POA: Insufficient documentation

## 2015-07-29 DIAGNOSIS — Z87440 Personal history of urinary (tract) infections: Secondary | ICD-10-CM | POA: Diagnosis not present

## 2015-07-29 HISTORY — PX: SHOULDER ARTHROSCOPY: SHX128

## 2015-07-29 SURGERY — ARTHROSCOPY, SHOULDER
Anesthesia: General | Laterality: Right

## 2015-07-29 MED ORDER — ROPIVACAINE HCL 5 MG/ML IJ SOLN
INTRAMUSCULAR | Status: AC
Start: 2015-07-29 — End: 2015-07-29
  Administered 2015-07-29: 20 mL via PERINEURAL
  Filled 2015-07-29: qty 20

## 2015-07-29 MED ORDER — LACTATED RINGERS IV SOLN
INTRAVENOUS | Status: DC
  Administered 2015-07-29 (×2): via INTRAVENOUS

## 2015-07-29 MED ORDER — ONDANSETRON HCL 4 MG/2ML IJ SOLN
INTRAMUSCULAR | Status: DC | PRN
  Administered 2015-07-29: 4 mg via INTRAVENOUS

## 2015-07-29 MED ORDER — ONDANSETRON HCL 4 MG PO TABS: 4.0000 mg | ORAL_TABLET | Freq: Four times a day (QID) | ORAL | Status: DC | PRN

## 2015-07-29 MED ORDER — PROMETHAZINE HCL 25 MG/ML IJ SOLN
6.2500 mg | Freq: Once | INTRAMUSCULAR | Status: AC
  Administered 2015-07-29: 6.25 mg via INTRAVENOUS

## 2015-07-29 MED ORDER — PROMETHAZINE HCL 25 MG/ML IJ SOLN
INTRAMUSCULAR | Status: AC
  Administered 2015-07-29: 6.25 mg via INTRAVENOUS
  Filled 2015-07-29: qty 1

## 2015-07-29 MED ORDER — MIDAZOLAM HCL 2 MG/2ML IJ SOLN
INTRAMUSCULAR | Status: DC | PRN
  Administered 2015-07-29: 2 mg via INTRAVENOUS

## 2015-07-29 MED ORDER — LIDOCAINE HCL (PF) 1 % IJ SOLN
INTRAMUSCULAR | Status: AC
  Filled 2015-07-29: qty 5

## 2015-07-29 MED ORDER — MIDAZOLAM HCL 5 MG/5ML IJ SOLN
INTRAMUSCULAR | Status: AC
  Administered 2015-07-29: 1 mg via INTRAVENOUS
  Filled 2015-07-29: qty 5

## 2015-07-29 MED ORDER — BUPIVACAINE-EPINEPHRINE (PF) 0.5% -1:200000 IJ SOLN
INTRAMUSCULAR | Status: DC | PRN
  Administered 2015-07-29: 30 mL via PERINEURAL

## 2015-07-29 MED ORDER — FENTANYL CITRATE (PF) 100 MCG/2ML IJ SOLN: 50.0000 ug | Freq: Once | INTRAMUSCULAR | Status: DC

## 2015-07-29 MED ORDER — LIDOCAINE HCL (CARDIAC) 20 MG/ML IV SOLN
INTRAVENOUS | Status: DC | PRN
  Administered 2015-07-29: 40 mg via INTRAVENOUS

## 2015-07-29 MED ORDER — SCOPOLAMINE 1 MG/3DAYS TD PT72
MEDICATED_PATCH | TRANSDERMAL | Status: AC
  Filled 2015-07-29: qty 1

## 2015-07-29 MED ORDER — EPINEPHRINE HCL 1 MG/ML IJ SOLN
INTRAMUSCULAR | Status: AC
  Filled 2015-07-29: qty 1

## 2015-07-29 MED ORDER — FENTANYL CITRATE (PF) 100 MCG/2ML IJ SOLN
INTRAMUSCULAR | Status: DC | PRN
  Administered 2015-07-29: 50 ug via INTRAVENOUS

## 2015-07-29 MED ORDER — BUPIVACAINE-EPINEPHRINE (PF) 0.5% -1:200000 IJ SOLN
INTRAMUSCULAR | Status: AC
  Filled 2015-07-29: qty 30

## 2015-07-29 MED ORDER — METOCLOPRAMIDE HCL 5 MG/ML IJ SOLN: 5.0000 mg | Freq: Three times a day (TID) | INTRAMUSCULAR | Status: DC | PRN

## 2015-07-29 MED ORDER — ONDANSETRON HCL 4 MG/2ML IJ SOLN: 4.0000 mg | Freq: Four times a day (QID) | INTRAMUSCULAR | Status: DC | PRN

## 2015-07-29 MED ORDER — POTASSIUM CHLORIDE IN NACL 20-0.9 MEQ/L-% IV SOLN
INTRAVENOUS | Status: DC
  Filled 2015-07-29: qty 1000

## 2015-07-29 MED ORDER — METOCLOPRAMIDE HCL 10 MG PO TABS: 5.0000 mg | ORAL_TABLET | Freq: Three times a day (TID) | ORAL | Status: DC | PRN

## 2015-07-29 MED ORDER — ONDANSETRON HCL 4 MG/2ML IJ SOLN
INTRAMUSCULAR | Status: AC
  Filled 2015-07-29: qty 2

## 2015-07-29 MED ORDER — ROCURONIUM BROMIDE 100 MG/10ML IV SOLN
INTRAVENOUS | Status: DC | PRN
  Administered 2015-07-29: 35 mg via INTRAVENOUS

## 2015-07-29 MED ORDER — SODIUM CHLORIDE 0.9 % IJ SOLN
INTRAMUSCULAR | Status: AC
  Filled 2015-07-29: qty 10

## 2015-07-29 MED ORDER — OXYCODONE HCL 5 MG PO TABS: 5.0000 mg | ORAL_TABLET | ORAL | Status: DC | PRN

## 2015-07-29 MED ORDER — CEFAZOLIN SODIUM-DEXTROSE 2-4 GM/100ML-% IV SOLN
2.0000 g | Freq: Once | INTRAVENOUS | Status: AC
  Administered 2015-07-29: 2 g via INTRAVENOUS

## 2015-07-29 MED ORDER — MIDAZOLAM HCL 5 MG/5ML IJ SOLN
1.0000 mg | Freq: Once | INTRAMUSCULAR | Status: AC
  Administered 2015-07-29: 1 mg via INTRAVENOUS

## 2015-07-29 MED ORDER — SUGAMMADEX SODIUM 200 MG/2ML IV SOLN
INTRAVENOUS | Status: DC | PRN
  Administered 2015-07-29: 135.2 mg via INTRAVENOUS

## 2015-07-29 MED ORDER — FENTANYL CITRATE (PF) 100 MCG/2ML IJ SOLN
INTRAMUSCULAR | Status: AC
  Administered 2015-07-29: 50 ug
  Filled 2015-07-29: qty 2

## 2015-07-29 MED ORDER — FENTANYL CITRATE (PF) 100 MCG/2ML IJ SOLN: 25.0000 ug | INTRAMUSCULAR | Status: DC | PRN

## 2015-07-29 MED ORDER — ONDANSETRON HCL 4 MG/2ML IJ SOLN
4.0000 mg | Freq: Once | INTRAMUSCULAR | Status: AC | PRN
  Administered 2015-07-29: 4 mg via INTRAVENOUS

## 2015-07-29 MED ORDER — EPHEDRINE SULFATE 50 MG/ML IJ SOLN
INTRAMUSCULAR | Status: DC | PRN
  Administered 2015-07-29: 10 mg via INTRAVENOUS

## 2015-07-29 MED ORDER — EPINEPHRINE HCL 1 MG/ML IJ SOLN
INTRAMUSCULAR | Status: DC | PRN
  Administered 2015-07-29: 2 mL

## 2015-07-29 MED ORDER — SCOPOLAMINE 1 MG/3DAYS TD PT72
1.0000 | MEDICATED_PATCH | Freq: Once | TRANSDERMAL | Status: DC
  Administered 2015-07-29: 1.5 mg via TRANSDERMAL

## 2015-07-29 MED ORDER — CEFAZOLIN SODIUM-DEXTROSE 2-4 GM/100ML-% IV SOLN
INTRAVENOUS | Status: AC
  Filled 2015-07-29: qty 100

## 2015-07-29 MED ORDER — DEXAMETHASONE SODIUM PHOSPHATE 10 MG/ML IJ SOLN
INTRAMUSCULAR | Status: DC | PRN
  Administered 2015-07-29: 10 mg via INTRAVENOUS

## 2015-07-29 MED ORDER — PROPOFOL 10 MG/ML IV BOLUS
INTRAVENOUS | Status: DC | PRN
  Administered 2015-07-29: 170 mg via INTRAVENOUS

## 2015-07-29 SURGICAL SUPPLY — 55 items
BIT DRILL JUGRKNT W/NDL BIT2.9 (DRILL) ×2 IMPLANT
BLADE FULL RADIUS 3.5 (BLADE) ×1 IMPLANT
BLADE SHAVER 4.5X7 STR FR (MISCELLANEOUS) ×3 IMPLANT
BUR ACROMIONIZER 4.0 (BURR) ×3 IMPLANT
BUR BR 5.5 WIDE MOUTH (BURR) ×1 IMPLANT
CANNULA 8.5X75 THRED (CANNULA) ×1 IMPLANT
CANNULA SHAVER 8MMX76MM (CANNULA) ×3 IMPLANT
CHLORAPREP W/TINT 26ML (MISCELLANEOUS) ×6 IMPLANT
DRAPE IMP U-DRAPE 54X76 (DRAPES) ×6 IMPLANT
DRAPE SURG 17X11 SM STRL (DRAPES) ×3 IMPLANT
DRILL JUGGERKNOT W/NDL BIT 2.9 (DRILL)
DRSG OPSITE POSTOP 4X8 (GAUZE/BANDAGES/DRESSINGS) ×1 IMPLANT
ELECT REM PT RETURN 9FT ADLT (ELECTROSURGICAL) ×3
ELECTRODE REM PT RTRN 9FT ADLT (ELECTROSURGICAL) ×1 IMPLANT
GAUZE PETRO XEROFOAM 1X8 (MISCELLANEOUS) ×3 IMPLANT
GAUZE SPONGE 4X4 12PLY STRL (GAUZE/BANDAGES/DRESSINGS) ×3 IMPLANT
GLOVE BIO SURGEON STRL SZ7.5 (GLOVE) ×6 IMPLANT
GLOVE BIO SURGEON STRL SZ8 (GLOVE) ×6 IMPLANT
GLOVE BIOGEL PI IND STRL 8 (GLOVE) ×1 IMPLANT
GLOVE BIOGEL PI INDICATOR 8 (GLOVE) ×2
GLOVE INDICATOR 8.0 STRL GRN (GLOVE) ×3 IMPLANT
GOWN STRL REUS W/ TWL LRG LVL3 (GOWN DISPOSABLE) ×2 IMPLANT
GOWN STRL REUS W/ TWL XL LVL3 (GOWN DISPOSABLE) ×1 IMPLANT
GOWN STRL REUS W/TWL LRG LVL3 (GOWN DISPOSABLE) ×6
GOWN STRL REUS W/TWL XL LVL3 (GOWN DISPOSABLE) ×3
GRASPER SUT 15 45D LOW PRO (SUTURE) ×2 IMPLANT
IV LACTATED RINGER IRRG 3000ML (IV SOLUTION) ×6
IV LR IRRIG 3000ML ARTHROMATIC (IV SOLUTION) ×2 IMPLANT
MANIFOLD NEPTUNE II (INSTRUMENTS) ×3 IMPLANT
MASK FACE SPIDER DISP (MASK) ×3 IMPLANT
MAT BLUE FLOOR 46X72 FLO (MISCELLANEOUS) ×3 IMPLANT
NDL MAYO 6 CRC TAPER PT (NEEDLE) ×1 IMPLANT
NDL MAYO CATGUT SZ 2 (NEEDLE) ×1 IMPLANT
NDL MAYO CATGUT SZ4 (NEEDLE) IMPLANT
NDL MAYO CATGUT SZ4 TCR NDL (NEEDLE) ×1 IMPLANT
NDL REVERSE CUT 1/2 CRC (NEEDLE) ×1 IMPLANT
NEEDLE HYPO 22GX1.5 SAFETY (NEEDLE) ×3 IMPLANT
NEEDLE MAYO 6 CRC TAPER PT (NEEDLE) IMPLANT
NEEDLE MAYO CATGUT SZ 1.5 (NEEDLE)
NEEDLE MAYO CATGUT SZ 2 (NEEDLE) IMPLANT
NEEDLE REVERSE CUT 1/2 CRC (NEEDLE) IMPLANT
PACK ARTHROSCOPY SHOULDER (MISCELLANEOUS) ×3 IMPLANT
SLING ARM LRG DEEP (SOFTGOODS) ×1 IMPLANT
SLING ULTRA II LG (MISCELLANEOUS) ×1 IMPLANT
STAPLER SKIN PROX 35W (STAPLE) ×3 IMPLANT
STRAP SAFETY BODY (MISCELLANEOUS) ×3 IMPLANT
SUT ETHIBOND 0 MO6 C/R (SUTURE) ×3 IMPLANT
SUT PROLENE 4 0 PS 2 18 (SUTURE) ×3 IMPLANT
SUT VIC AB 2-0 CT1 27 (SUTURE) ×6
SUT VIC AB 2-0 CT1 TAPERPNT 27 (SUTURE) ×2 IMPLANT
TAPE MICROFOAM 4IN (TAPE) ×3 IMPLANT
TUBING ARTHRO INFLOW-ONLY STRL (TUBING) ×3 IMPLANT
TUBING CONNECTING 10 (TUBING) ×2 IMPLANT
TUBING CONNECTING 10' (TUBING) ×1
WAND HAND CNTRL MULTIVAC 90 (MISCELLANEOUS) ×3 IMPLANT

## 2015-07-29 NOTE — Anesthesia Postprocedure Evaluation (Signed)
Anesthesia Post Note  Patient: Beth Beck  Procedure(s) Performed: Procedure(s) (LRB):  right shoulder arthroscopy, debridement of frayed labrium, excision of retained anchor (Right)  Patient location during evaluation: PACU Anesthesia Type: General Level of consciousness: awake Pain management: pain level controlled Vital Signs Assessment: post-procedure vital signs reviewed and stable Respiratory status: spontaneous breathing Cardiovascular status: stable Anesthetic complications: no    Last Vitals:  Filed Vitals:   07/29/15 1254 07/29/15 1310  BP: 161/75 153/69  Pulse: 63 64  Temp: 35.6 C   Resp: 14 18    Last Pain:  Filed Vitals:   07/29/15 1337  PainSc: Asleep                 VAN STAVEREN,Shilpa Bushee

## 2015-07-29 NOTE — OR Nursing (Signed)
Pt to PACU for Block.

## 2015-07-29 NOTE — H&P (Signed)
Paper H&P to be scanned into permanent record. H&P reviewed. No changes. 

## 2015-07-29 NOTE — Anesthesia Procedure Notes (Addendum)
Anesthesia Regional Block:  Interscalene brachial plexus block  Pre-Anesthetic Checklist: ,, timeout performed, Correct Patient, Correct Site, Correct Laterality, Correct Procedure, Correct Position, site marked, Risks and benefits discussed, Surgical consent,  Pre-op evaluation,  At surgeon's request  Laterality: Right  Prep: alcohol swabs       Needles:  Injection technique: Single-shot  Needle Type: Stimulator Needle - 80     Needle Length: 9cm 9 cm Needle Gauge: 22 and 22 G  Needle insertion depth: 2 cm   Additional Needles:  Procedures: nerve stimulator Interscalene brachial plexus block  Nerve Stimulator or Paresthesia:  Response: yes, 0.38 mA, 1 ms, 2 cm  Additional Responses:   Narrative:  Start time: 07/29/2015 9:55 AM End time: 07/29/2015 10:05 AM Injection made incrementally with aspirations every 5 mL.  Performed by: Personally  Anesthesiologist: Elijio MilesVAN STAVEREN, GIJSBERTUS F   Procedure Name: Intubation Date/Time: 07/29/2015 11:20 AM Performed by: Henrietta HooverPOPE, Terreon Ekholm Pre-anesthesia Checklist: Patient identified, Emergency Drugs available, Suction available, Patient being monitored and Timeout performed Patient Re-evaluated:Patient Re-evaluated prior to inductionOxygen Delivery Method: Circle system utilized Preoxygenation: Pre-oxygenation with 100% oxygen Intubation Type: IV induction Ventilation: Mask ventilation with difficulty Laryngoscope Size: McGraph and 3 Grade View: Grade II Tube type: Oral Tube size: 7.0 mm Number of attempts: 1 Airway Equipment and Method: Stylet Placement Confirmation: ETT inserted through vocal cords under direct vision,  positive ETCO2 and breath sounds checked- equal and bilateral Secured at: 23 cm Tube secured with: Tape Dental Injury: Teeth and Oropharynx as per pre-operative assessment

## 2015-07-29 NOTE — Transfer of Care (Signed)
Immediate Anesthesia Transfer of Care Note  Patient: Beth Beck  Procedure(s) Performed: Procedure(s):  right shoulder arthroscopy, debridement of frayed labrium, excision of retained anchor (Right)  Patient Location: PACU  Anesthesia Type:General  Level of Consciousness: sedated  Airway & Oxygen Therapy: Patient Spontanous Breathing and Patient connected to face mask oxygen  Post-op Assessment: Report given to RN and Post -op Vital signs reviewed and stable  Post vital signs: Reviewed and stable  Last Vitals:  Filed Vitals:   07/29/15 1049 07/29/15 1100  BP: 150/69   Pulse: 53 56  Temp:    Resp: 11 11    Complications: No apparent anesthesia complications

## 2015-07-29 NOTE — Op Note (Signed)
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  Postoperative diagnosis: Impingement/tendinopathy with labral fraying and retained suture anchor, right shoulder.  Procedure: Arthroscopic labral debridement and mini-open rotator cuff repair with excision of retained loose suture anchor, right shoulder.  Anesthesia: General endotracheal with interscalene block placed preoperatively by the anesthesiologist.  Surgeon:   [name]. Jeffrey [name], MD  Assistant:   Horris LatinoLance McGhee, PA-C  Findings: As above. There was moderate fraying of the anterior labrum without glenoid detachment. The rotator cuff appeared to be in excellent condition from the articular side, including the subscapularis, supraspinatus, and after spinatus tendons. There was focal grade 1-2 chondromalacia involving the anterior portion of the humeral head near the bicipital groove. Otherwise, the articular surfaces of both the glenoid and humerus were in excellent condition.  Complications: None  Fluids:   700 cc  Estimated blood loss: 3 cc  Tourniquet time: None  Drains: None  Closure: Staples   Brief clinical note: The patient is a 70 year old female with a history of persistent painful catching of her right shoulder status post two prior shoulder procedures, including a rotator cuff repair and decompression. The patient's symptoms have progressed despite medications, activity modification, etc. The patient's history and examination are consistent with impingement/tendinopathy with a possible recurrent rotator cuff tear. An MRI scan was inconclusive for a recurrent rotator cuff tear. The patient presents at this time for arthroscopy, debridement, and repair of any torn structures if present.  Procedure: The patient underwent placement of an interscalene block by the anesthesiologist in the preoperative  holding area before she was brought into the operating room and lain in the supine position. The patient then underwent general endotracheal intubation and anesthesia before being repositioned in the beach chair position using the beach chair positioner. The right shoulder and upper extremity were prepped with ChloraPrep solution before being draped sterilely. Preoperative antibiotics were administered. A timeout was performed to confirm the proper surgical site before the expected portal sites and incision site were injected with 0.5% Sensorcaine with epinephrine. A posterior portal was created and the glenohumeral joint thoroughly inspected with the findings as described above. An anterior portal was created using an outside-in technique. The labrum and rotator cuff were further probed, again confirming the above-noted findings. The areas of labral fraying were debrided using the full-radius resector after the labrum was carefully probed and found to be intact. The ArthroCare wand was inserted and used to obtain hemostasis as well as to "anneal" the labrum anteriorly. The instruments were removed from the joint after suctioning the excess fluid.  The camera was repositioned through the posterior portal into the subacromial space. A separate lateral portal was created using an outside-in technique. The 3.5 mm full-radius resector was introduced and used to debride the adherent scar tissue in the subacromial space. The ArthroCare wand was then inserted and used to continue this debridement, as well as to obtain hemostasis. In the area of the anterolateral aspect of the rotator cuff involving the mid-insertional fibers of the supraspinatus, a prominent corkscrew-type Peek anchor was identified. After a prolonged attempt at removing this anchor arthroscopically with a grasper, it was elected to proceed with a mini open technique as the Peek material kept cracking. The instruments were then removed from the  subacromial space after suctioning the excess fluid.  An approximately 3-4 cm incision was made over the anterolateral aspect of the shoulder beginning  at the anterolateral corner of the acromion and extending distally in line with the bicipital groove. This incision was carried down through the subcutaneous tissues to expose the deltoid fascia. The raphae between the anterior and middle thirds was identified and this plane developed to provide access into the subacromial space. Additional bursal tissues were debrided sharply using Metzenbaum scissors. The prominent anchor and associated suture material were readily identified. This material was ellipsed with a #15 blade and removed. The residual rotator cuff tear was repaired using two #0 Ethibond interrupted sutures placed in a side-to-side fashion. An apparent watertight closure was obtained.  The wound was copiously irrigated with sterile saline solution before the deltoid raphae was reapproximated using 2-0 Vicryl interrupted sutures. The subcutaneous tissues were closed in two layers using 2-0 Vicryl interrupted sutures before the skin was closed using staples. The portal sites also were closed using staples. A sterile bulky dressing was applied to the shoulder before the arm was placed into a shoulder immobilizer. The patient was then awakened, extubated, and returned to the recovery room in satisfactory condition after tolerating the procedure well.

## 2015-07-29 NOTE — Anesthesia Preprocedure Evaluation (Signed)
Anesthesia Evaluation  Patient identified by MRN, date of birth, ID band Patient awake    Reviewed: Allergy & Precautions, NPO status , Patient's Chart, lab work & pertinent test results  History of Anesthesia Complications (+) PONV  Airway Mallampati: III  TM Distance: <3 FB Neck ROM: Full    Dental  (+) Teeth Intact   Pulmonary sleep apnea ,    breath sounds clear to auscultation       Cardiovascular Exercise Tolerance: Good  Rhythm:Regular Rate:Normal     Neuro/Psych Anxiety    GI/Hepatic Neg liver ROS, GERD  ,  Endo/Other  negative endocrine ROS  Renal/GU negative Renal ROS     Musculoskeletal   Abdominal Normal abdominal exam  (+)   Peds  Hematology  (+) anemia ,   Anesthesia Other Findings   Reproductive/Obstetrics                             Anesthesia Physical Anesthesia Plan  ASA: II  Anesthesia Plan: General   Post-op Pain Management:    Induction: Intravenous  Airway Management Planned: Oral ETT  Additional Equipment:   Intra-op Plan:   Post-operative Plan: Extubation in OR  Informed Consent: I have reviewed the patients History and Physical, chart, labs and discussed the procedure including the risks, benefits and alternatives for the proposed anesthesia with the patient or authorized representative who has indicated his/her understanding and acceptance.     Plan Discussed with: CRNA  Anesthesia Plan Comments:         Anesthesia Quick Evaluation

## 2015-07-29 NOTE — Discharge Instructions (Addendum)
Keep dressing dry and intact.  °May shower after dressing changed on post-op day #4 (Saturday).  °Cover staples with Band-Aids after drying off. °Apply ice frequently to shoulder. °Keep shoulder immobilizer on at all times except may remove for bathing purposes. °Follow-up in 10-14 days or as scheduled.AMBULATORY SURGERY  °DISCHARGE INSTRUCTIONS ° ° °1) The drugs that you were given will stay in your system until tomorrow so for the next 24 hours you should not: ° °A) Drive an automobile °B) Make any legal decisions °C) Drink any alcoholic beverage ° ° °2) You may resume regular meals tomorrow.  Today it is better to start with liquids and gradually work up to solid foods. ° °You may eat anything you prefer, but it is better to start with liquids, then soup and crackers, and gradually work up to solid foods. ° ° °3) Please notify your doctor immediately if you have any unusual bleeding, trouble breathing, redness and pain at the surgery site, drainage, fever, or pain not relieved by medication. ° ° ° °4) Additional Instructions: ° ° ° ° ° ° ° °Please contact your physician with any problems or Same Day Surgery at [phone], Monday through Friday 6 am to 4 pm, or [facility] [place] Main number at [phone]. °

## 2015-07-30 ENCOUNTER — Encounter: Payer: Self-pay | Admitting: Surgery

## 2015-10-24 ENCOUNTER — Other Ambulatory Visit: Payer: Self-pay | Admitting: Internal Medicine

## 2015-10-24 DIAGNOSIS — Z1231 Encounter for screening mammogram for malignant neoplasm of breast: Secondary | ICD-10-CM

## 2015-12-03 ENCOUNTER — Other Ambulatory Visit: Payer: Self-pay | Admitting: Internal Medicine

## 2015-12-03 ENCOUNTER — Ambulatory Visit
Admission: RE | Admit: 2015-12-03 | Discharge: 2015-12-03 | Disposition: A | Discharge status: Home / Self Care | Payer: Medicare Other | Source: Ambulatory Visit | Attending: Internal Medicine | Admitting: Internal Medicine

## 2015-12-03 DIAGNOSIS — Z1231 Encounter for screening mammogram for malignant neoplasm of breast: Secondary | ICD-10-CM

## 2016-04-01 ENCOUNTER — Other Ambulatory Visit: Payer: Self-pay | Admitting: Internal Medicine

## 2016-04-01 DIAGNOSIS — R05 Cough: Secondary | ICD-10-CM

## 2016-04-01 DIAGNOSIS — R0781 Pleurodynia: Secondary | ICD-10-CM

## 2016-04-01 DIAGNOSIS — R059 Cough, unspecified: Secondary | ICD-10-CM

## 2016-04-08 ENCOUNTER — Ambulatory Visit
Admission: RE | Admit: 2016-04-08 | Discharge: 2016-04-08 | Disposition: A | Discharge status: Home / Self Care | Payer: Medicare Other | Source: Ambulatory Visit | Attending: Internal Medicine | Admitting: Internal Medicine

## 2016-04-08 DIAGNOSIS — R918 Other nonspecific abnormal finding of lung field: Secondary | ICD-10-CM | POA: Insufficient documentation

## 2016-04-08 DIAGNOSIS — R05 Cough: Secondary | ICD-10-CM | POA: Insufficient documentation

## 2016-04-08 DIAGNOSIS — R059 Cough, unspecified: Secondary | ICD-10-CM

## 2016-04-08 DIAGNOSIS — R0781 Pleurodynia: Secondary | ICD-10-CM | POA: Insufficient documentation

## 2016-04-08 MED ORDER — IOPAMIDOL (ISOVUE-370) INJECTION 76%
75.0000 mL | Freq: Once | INTRAVENOUS | Status: AC | PRN
  Administered 2016-04-08: 75 mL via INTRAVENOUS

## 2016-04-15 ENCOUNTER — Encounter: Payer: Self-pay | Admitting: Internal Medicine

## 2016-04-15 ENCOUNTER — Ambulatory Visit (INDEPENDENT_AMBULATORY_CARE_PROVIDER_SITE_OTHER): Payer: Medicare Other | Admitting: Internal Medicine

## 2016-04-15 VITALS — BP 132/88 | HR 57 | Temp 97.6°F | Ht 66.0 in | Wt 145.0 lb

## 2016-04-15 DIAGNOSIS — J181 Lobar pneumonia, unspecified organism: Secondary | ICD-10-CM

## 2016-04-15 DIAGNOSIS — J189 Pneumonia, unspecified organism: Secondary | ICD-10-CM

## 2016-04-15 MED ORDER — FLUTICASONE FUROATE-VILANTEROL 200-25 MCG/INH IN AEPB: 1.0000 | INHALATION_SPRAY | Freq: Every day | RESPIRATORY_TRACT | 0 refills | Status: DC

## 2016-04-15 NOTE — Progress Notes (Addendum)
[facility] Consultation      Date: 04/15/2016,   MRN# [id number] Beth Beck [birth year] Code Status:  Code Status History    This patient does not have a recorded code status. Please follow your organizational policy for patients in this situation.     Hosp day:'@[email or web address]' @ Referring MD: '@[email or web address]' @     PCP:      AdmissionWeight: 145 lb (65.8 kg)                 CurrentWeight: 145 lb (65.8 kg) Beth Beck is a 70 y.o. old female seen in consultation for pneumonia  at the request of Dr. Doy Hutching     CHIEF COMPLAINT:   SOB and Productive cough   HISTORY OF PRESENT ILLNESS   70 yo white female seen today for abnormal CT chest Patient was told by PCP office that she has lung mass After review of CT chest, patient has RML atelectasis likely from pneumonia, no lung mass  Patient has h/o chronic sinusitis s/p ENT surgery x 4 in the past Patient states that she has had h/o MRSA sinus infection in the past Has been on many antihistamines and nasal sprays Patient has constant mucus production and nasal congestion for many years  She has had increased productive cough last 2 weeks with green tinged sputum Patient also had chest wall soreness from coughing  Patient has been given amoxacillin for 10 days and she feels much better She still has some mucus production but improved  No signs of infection at this time I have explained to patient that she may benefit from Bronchoscopy to assess airways for mucus and masses, but I anticipate NL airways with mucus plugs-Patient understands   I have exp   PAST MEDICAL HISTORY   Past Medical History:  Diagnosis Date  . Anemia   . Anxiety   . Chronic kidney disease    History Of UTI  . GERD (gastroesophageal reflux disease)   . Headache    migraines - none recently, on medicine regimen  . Irritable bowel syndrome (IBS)   . Neuromuscular disorder ([place])    Right arm weakness s/p RCR surg x2  .  PONV (postoperative nausea and vomiting)   . Post herpetic neuralgia    right rib cage. shingles approx 7 yrs ago  . Shingles    approx 7 yrs ago - right ribs  . Sinus congestion   . Sleep apnea    fixed with UPPP surgery     SURGICAL HISTORY   Past Surgical History:  Procedure Laterality Date  . ABDOMINAL HYSTERECTOMY    . BREAST BIOPSY Left 10+yrs ago   neg.  . CHOLECYSTECTOMY    . ETHMOIDECTOMY Bilateral 02/18/2015   Procedure: BILATERAL TOTAL ETHMOIDECTOMY;  Surgeon: Clyde Canterbury, MD;  Location: [facility];  Service: ENT;  Laterality: Bilateral;  . FRONTAL SINUS EXPLORATION Bilateral 02/18/2015   Procedure: BILATERAL FRONTAL RECESS SINUS EXPLORATION;  Surgeon: Clyde Canterbury, MD;  Location: [facility];  Service: ENT;  Laterality: Bilateral;  . IMAGE GUIDED SINUS SURGERY N/A 02/18/2015   Procedure: IMAGE GUIDED SINUS SURGERY;  Surgeon: Clyde Canterbury, MD;  Location: [facility];  Service: ENT;  Laterality: N/A;  GAVE DISK TO CE CE  . MAXILLARY ANTROSTOMY  02/18/2015   Procedure: MAXILLARY ANTROSTOMY right side with tissue removal.;  Surgeon: Clyde Canterbury, MD;  Location: [facility];  Service: ENT;;  . NASAL SINUS SURGERY  May 23, 2015   Dr.  Bradd Burner, [place] SHOULDER ARTHROSCOPY Right 07/29/2015   Procedure:  right shoulder arthroscopy, debridement of frayed labrium, excision of retained anchor;  Surgeon: Corky Mull, MD;  Location: [facility];  Service: Orthopedics;  Laterality: Right;  . SPHENOIDECTOMY Bilateral 02/18/2015   Procedure: REVISION BILATERAL SPHENOIDECTOMY;  Surgeon: Clyde Canterbury, MD;  Location: [facility];  Service: ENT;  Laterality: Bilateral;  . TONSILLECTOMY    . TUBAL LIGATION    . UVULOPALATOPHARYNGOPLASTY       FAMILY HISTORY   Family History  Problem Relation Age of Onset  . Aneurysm Mother   . Heart failure Father      SOCIAL HISTORY   Social History  Substance Use Topics  . Smoking status:  Never Smoker  . Smokeless tobacco: Never Used  . Alcohol use No     MEDICATIONS    Home Medication:  Current Outpatient Rx  . Order #: [id number] Class: Historical Med  . Order #: [id number] Class: Historical Med  . Order #: [id number] Class: Historical Med  . Order #: [id number] Class: Historical Med  . Order #: [id number] Class: Historical Med  . Order #: [id number] Class: Historical Med  . Order #: [id number] Class: Historical Med  . Order #: [id number] Class: Historical Med  . Order #: [id number] Class: Historical Med  . Order #: [id number] Class: Historical Med  . Order #: [id number] Class: Historical Med  . Order #: [id number] Class: Historical Med  . Order #: [id number] Class: Historical Med  . Order #: [id number] Class: Historical Med  . Order #: [id number] Class: Print  . Order #: [id number] Class: Historical Med    Current Medication:  Current Outpatient Prescriptions:  .  aspirin (BAYER ASPIRIN EC LOW DOSE) 81 MG EC tablet, Take 81 mg by mouth daily. Swallow whole., Disp: , Rfl:  .  butalbital-acetaminophen-caffeine (FIORICET) 50-325-40 MG tablet, Take 1 tablet by mouth daily. PM, Disp: , Rfl:  .  CALCIUM-MAGNESIUM-ZINC PO, Take 2 tablets by mouth every morning. One tablet at bedtime, Disp: , Rfl:  .  Cyanocobalamin (VITAMIN B-12 PO), Take 2,500 mcg by mouth daily. , Disp: , Rfl:  .  esomeprazole (NEXIUM) 40 MG capsule, Take 40 mg by mouth every morning. AM, Disp: , Rfl:  .  estrogens, conjugated, (PREMARIN) 0.625 MG tablet, Take 0.625 mg by mouth daily. Take daily for 21 days then do not take for 7 days., Disp: , Rfl:  .  fluticasone (FLONASE) 50 MCG/ACT nasal spray, Place 2 sprays into both nostrils 2 (two) times daily. , Disp: , Rfl:  .  gabapentin (NEURONTIN) 300 MG capsule, Take 600 mg by mouth 2 (two) times daily., Disp: , Rfl:  .  Hypertonic Nasal Wash (SINUS RINSE BOTTLE KIT NA), Place into the nose 2 (two) times daily., Disp: , Rfl:  .  lovastatin (MEVACOR) 40 MG tablet, Take 40 mg by  mouth at bedtime., Disp: , Rfl:  .  metoCLOPramide (REGLAN) 10 MG tablet, Take 10 mg by mouth daily. PM, Disp: , Rfl:  .  Multiple Vitamins-Minerals (CENTRUM SILVER PO), Take 1 tablet by mouth daily. , Disp: , Rfl:  .  nadolol (CORGARD) 40 MG tablet, Take 40 mg by mouth daily. PM, Disp: , Rfl:  .  naproxen (NAPROSYN) 500 MG tablet, Take 500 mg by mouth 2 (two) times daily with a meal., Disp: , Rfl:  .  oxyCODONE (ROXICODONE) 5 MG immediate release tablet, Take 1-2 tablets (5-10 mg total) by mouth every 4 (four) hours as needed for severe pain., Disp: 60 tablet, Rfl: 0 .  Probiotic  Product (PROBIOTIC DAILY) CAPS, Take 1 capsule by mouth daily., Disp: , Rfl:     ALLERGIES   Bactrim [sulfamethoxazole-trimethoprim]; Clindamycin/lincomycin; Cymbalta [duloxetine hcl]; Escitalopram; Meloxicam; Sulfa antibiotics; and Tetracyclines & related     REVIEW OF SYSTEMS   Review of Systems  Constitutional: Negative for chills, diaphoresis, fever, malaise/fatigue and weight loss.  HENT: Negative for congestion and hearing loss.   Eyes: Negative for blurred vision and double vision.  Respiratory: Positive for cough, sputum production and shortness of breath. Negative for hemoptysis and wheezing.   Cardiovascular: Negative for chest pain, palpitations and orthopnea.  Gastrointestinal: Negative for abdominal pain, heartburn, nausea and vomiting.  Genitourinary: Negative for dysuria and urgency.  Musculoskeletal: Negative for back pain, myalgias and neck pain.  Skin: Negative for rash.  Neurological: Negative for dizziness, tingling, tremors, weakness and headaches.  Endo/Heme/Allergies: Does not bruise/bleed easily.  Psychiatric/Behavioral: Negative for depression, substance abuse and suicidal ideas.  All other systems reviewed and are negative.    VS: BP 132/88 (BP Location: Left Arm, Cuff Size: Normal)   Pulse (!) 57   Temp 97.6 F (36.4 C) (Oral)   Ht '5\' 6"'  (1.676 m)   Wt 145 lb (65.8 kg)    SpO2 98%   BMI 23.40 kg/m      PHYSICAL EXAM  Physical Exam  Constitutional: She is oriented to person, place, and time. She appears well-developed and well-nourished. No distress.  HENT:  Head: Normocephalic and atraumatic.  Mouth/Throat: No oropharyngeal exudate.  Eyes: EOM are normal. Pupils are equal, round, and reactive to light. No scleral icterus.  Neck: Normal range of motion. Neck supple.  Cardiovascular: Normal rate, regular rhythm and normal heart sounds.   No murmur heard. Pulmonary/Chest: No stridor. No respiratory distress. She has no wheezes.  Abdominal: Soft. Bowel sounds are normal.  Musculoskeletal: Normal range of motion. She exhibits no edema.  Neurological: She is alert and oriented to person, place, and time. No cranial nerve deficit.  Skin: Skin is warm. She is not diaphoretic.  Psychiatric: She has a normal mood and affect.           IMAGING    Ct Angio Chest Pe W Or Wo Contrast  Result Date: 04/08/2016 CLINICAL DATA:  Persistent cough, 2-3 months. Chest pain when eating or swallowing. EXAM: CT ANGIOGRAPHY CHEST WITH CONTRAST TECHNIQUE: Multidetector CT imaging of the chest was performed using the standard protocol during bolus administration of intravenous contrast. Multiplanar CT image reconstructions and MIPs were obtained to evaluate the vascular anatomy. CONTRAST:  75 cc Isovue 370 COMPARISON:  Radiography 05/30/2012 FINDINGS: Cardiovascular: Pulmonary arterial opacification is excellent. There are no pulmonary emboli. There is aortic atherosclerosis. No dissection. No pericardial fluid. Mediastinum/Nodes: Mildly prominent superior mediastinal lymph nodes without dominant nodal enlargement. Lungs/Pleura: The left lung is clear. Right lung shows volume loss in the inferior right upper lobe and in the medial right middle lobe which could be due to atelectasis/atelectatic pneumonia. Prominent tissue in the right hilum is consistent with nodal enlargement. I  cannot rule out a hilar mass with postobstructive collapse. Imaging follow-up to clearing would be recommended. Whereas these findings could simply be due to inflammatory disease, we have not excluded potential neoplastic etiology. There is apical pleural and parenchymal scarring right more than left. Someone nodular appearance on the right could also be followed on subsequent imaging. Upper Abdomen: Normal Musculoskeletal: Normal Review of the MIP images confirms the above findings. IMPRESSION: Volume loss in the inferior right upper lobe and  medial right middle lobe. Abnormal tissue in the right hilum. Slightly prominent mediastinal nodes. These findings are indeterminate for inflammatory disease versus neoplastic disease. At the least, follow-up to clearing should be done. If the patient does not respond well to treatment, I would suggest evaluation from the standpoint of potential right pulmonary and hilar mass. Electronically Signed   By: Nelson Chimes M.D.   On: 04/08/2016 09:13    Images reviewed 04/15/2016 RML atelectasis, mucus plugs    ASSESSMENT/PLAN   70 yo white female with h/o chronic sinusitis with persistent sinus issues with signs and symptoms of pneumonia with recent productive cough with RML infiltrate and mucus with atelectasis  1.will start BREO 200 and assess resp symptoms 2.will set up for Bronchoscopy for airways exam and obtain BAL r/o MRSA and MAI   I have personally obtained a history, examined the patient, evaluated laboratory and independently reviewed imaging results, formulated the assessment and plan and placed orders.  The Patient requires high complexity decision making for assessment and support, frequent evaluation and titration of therapies, application of advanced monitoring technologies and extensive interpretation of multiple databases.    Patient/Family are satisfied with Plan of action and management. All questions answered  Corrin Parker, M.D.    [facility]  Medical Director [place] Director [facility] Cardio-Pulmonary Department   I have reviewed the above note by Dr Mortimer Fries and Agree with plan for airway examiniation  Merton Border, MD PCCM service Mobile [phone] Pager [phone] 04/20/2016

## 2016-04-15 NOTE — Patient Instructions (Signed)
START BREO 200 daily Will set up for Bronchscopy with Dr Sung AmabileSimonds

## 2016-04-20 ENCOUNTER — Encounter: Payer: Self-pay | Admitting: Pulmonary Disease

## 2016-04-20 ENCOUNTER — Encounter
Admission: RE | Disposition: A | Discharge status: Home / Self Care | Payer: Self-pay | Source: Ambulatory Visit | Attending: Pulmonary Disease

## 2016-04-20 ENCOUNTER — Ambulatory Visit
Admission: RE | Admit: 2016-04-20 | Discharge: 2016-04-20 | Disposition: A | Discharge status: Home / Self Care | Payer: Medicare Other | Source: Ambulatory Visit | Attending: Pulmonary Disease | Admitting: Pulmonary Disease

## 2016-04-20 DIAGNOSIS — Z79890 Hormone replacement therapy (postmenopausal): Secondary | ICD-10-CM | POA: Insufficient documentation

## 2016-04-20 DIAGNOSIS — Z7951 Long term (current) use of inhaled steroids: Secondary | ICD-10-CM | POA: Diagnosis not present

## 2016-04-20 DIAGNOSIS — K219 Gastro-esophageal reflux disease without esophagitis: Secondary | ICD-10-CM | POA: Diagnosis not present

## 2016-04-20 DIAGNOSIS — Z79899 Other long term (current) drug therapy: Secondary | ICD-10-CM | POA: Insufficient documentation

## 2016-04-20 DIAGNOSIS — R938 Abnormal findings on diagnostic imaging of other specified body structures: Secondary | ICD-10-CM

## 2016-04-20 DIAGNOSIS — Z7982 Long term (current) use of aspirin: Secondary | ICD-10-CM | POA: Diagnosis not present

## 2016-04-20 DIAGNOSIS — G709 Myoneural disorder, unspecified: Secondary | ICD-10-CM | POA: Diagnosis not present

## 2016-04-20 DIAGNOSIS — Z791 Long term (current) use of non-steroidal anti-inflammatories (NSAID): Secondary | ICD-10-CM | POA: Insufficient documentation

## 2016-04-20 DIAGNOSIS — R918 Other nonspecific abnormal finding of lung field: Secondary | ICD-10-CM | POA: Diagnosis present

## 2016-04-20 DIAGNOSIS — J209 Acute bronchitis, unspecified: Secondary | ICD-10-CM | POA: Diagnosis not present

## 2016-04-20 HISTORY — PX: FLEXIBLE BRONCHOSCOPY: SHX5094

## 2016-04-20 SURGERY — BRONCHOSCOPY, FLEXIBLE
Anesthesia: Moderate Sedation

## 2016-04-20 MED ORDER — PHENYLEPHRINE HCL 0.25 % NA SOLN: 1.0000 | Freq: Four times a day (QID) | NASAL | Status: DC | PRN

## 2016-04-20 MED ORDER — MIDAZOLAM HCL 2 MG/2ML IJ SOLN
INTRAMUSCULAR | Status: AC | PRN
  Administered 2016-04-20: 2 mg via INTRAVENOUS

## 2016-04-20 MED ORDER — FENTANYL CITRATE (PF) 100 MCG/2ML IJ SOLN
INTRAMUSCULAR | Status: AC
  Administered 2016-04-20: 50 ug
  Filled 2016-04-20: qty 4

## 2016-04-20 MED ORDER — BUTAMBEN-TETRACAINE-BENZOCAINE 2-2-14 % EX AERO: 1.0000 | INHALATION_SPRAY | Freq: Once | CUTANEOUS | Status: DC

## 2016-04-20 MED ORDER — LIDOCAINE HCL 2 % EX GEL: 1.0000 "application " | Freq: Once | CUTANEOUS | Status: DC

## 2016-04-20 MED ORDER — MIDAZOLAM HCL 5 MG/5ML IJ SOLN
INTRAMUSCULAR | Status: AC
  Administered 2016-04-20: 2 mg
  Filled 2016-04-20: qty 10

## 2016-04-20 NOTE — Discharge Instructions (Signed)
Flexible Bronchoscopy, Care After These instructions give you information on caring for yourself after your procedure. Your doctor may also give you more specific instructions. Call your doctor if you have any problems or questions after your procedure. Follow these instructions at home:  Do not eat or drink anything for 2 hours after your procedure. If you try to eat or drink before the medicine wears off, food or drink could go into your lungs. You could also burn yourself.  After 2 hours have passed and when you can cough and gag normally, you may eat soft food and drink liquids slowly.  The day after the test, you may eat your normal diet.  You may do your normal activities.  Keep all doctor visits. Get help right away if:  You get more and more short of breath.  You get light-headed.  You feel like you are going to pass out (faint).  You have chest pain.  You have new problems that worry you.  You cough up more than a little blood.  You cough up more blood than before. This information is not intended to replace advice given to you by your health care provider. Make sure you discuss any questions you have with your health care provider. Document Released: 02/14/2009 Document Revised: 09/25/2015 Document Reviewed: 12/22/2012 Elsevier Interactive Patient Education  2017 Elsevier Inc.  

## 2016-04-20 NOTE — Progress Notes (Signed)
Pt clinically stable post bronchoscopy, vitals stable sats stable. Dr Sung AmabileSimonds out to speak with husband and patient with questions answered. Denies complaints at this time. Taking po's wthout difficulty. Discharge instructions given with questions answered. Dr Belia HemanKasa will speak with pt regarding results later this week.

## 2016-04-20 NOTE — Procedures (Signed)
Indication:   Abn CT chest  Premedication:   Fentanyl 50 mcg Midaz 4 mg  Anesthesia: Topical to nose and throat 30 cc of 1% lidocaine used during the course of procedure  Procedure: After adequate sedation and anesthesia, the bronchoscope was introduced via the R naris and advanced into the posterior pharynx. Further anesthesia was obtained with 1% lidocaine and the scope was advanced into the trachea. Complete airway anesthesia was achieved with 1% lidocaine and a thorough airway examination was performed. This revealed the following findings:  Findings:  Upper airway - normal anatomy, cords moved symmetrically Tracheobronchial anatomy - normal anatomic arrangement Bronchial mucosa - diffuse erythema and hyperemia c/w acute bronchitis Other - no endobronchial tumors or foreign bodies. Mild to moderate mucoid secretions throughout with moderate mucopurulent secretions in RUL, anterior segment  Specimens:   Washings from RUL sent for GS, bacterial culture, AFB smear and culture, cytology  Complications: None  Post procedure evaluation:  The patient tolerated the procedure well with no major complications CXR - not indicated   Billy Fischeravid Tovia Kisner, MD PCCM service Mobile [phone] Pager [phone]  04/20/2016

## 2016-04-21 LAB — ACID FAST SMEAR (AFB, MYCOBACTERIA): Acid Fast Smear: NEGATIVE

## 2016-04-21 LAB — ACID FAST SMEAR (AFB)

## 2016-04-21 LAB — CYTOLOGY - NON PAP

## 2016-04-22 ENCOUNTER — Encounter: Payer: Self-pay | Admitting: Pulmonary Disease

## 2016-04-22 ENCOUNTER — Telehealth: Payer: Self-pay | Admitting: Pulmonary Disease

## 2016-04-22 LAB — CULTURE, BAL-QUANTITATIVE

## 2016-04-22 LAB — CULTURE, BAL-QUANTITATIVE W GRAM STAIN

## 2016-04-22 NOTE — Telephone Encounter (Signed)
Tried to call pt. But no vm is set up yet. 

## 2016-04-22 NOTE — Telephone Encounter (Signed)
Patient says Dr. Sung AmabileSimonds recently did a procedure and she has fu with kasa.  Patient was told she was going to have rx for prednisone and a spray ? Throat.  Pharmacy does not have them .  Please call patient to discuss.

## 2016-04-22 NOTE — H&P (Signed)
See Dr Clovis FredricksonKasa's note with my addendum. There were no significant additions made  Billy Fischeravid Simonds, MD PCCM service Mobile [phone] Pager [phone] 04/22/2016

## 2016-04-23 NOTE — Telephone Encounter (Signed)
lmtcb X 2 

## 2016-04-27 MED ORDER — PREDNISONE 20 MG PO TABS: 40.0000 mg | ORAL_TABLET | Freq: Every day | ORAL | 0 refills | Status: AC

## 2016-04-27 MED ORDER — LEVOFLOXACIN 500 MG PO TABS: 500.0000 mg | ORAL_TABLET | Freq: Every day | ORAL | 0 refills | Status: DC

## 2016-04-27 NOTE — Telephone Encounter (Signed)
Her BAL grew staph aureus. I have sebt Rx to her pharmacy for Levofloxacin for 7 days and prednisone for 5 days. She is already on an inhaled steroid Virgel Bouquet(Breo)  Theodoro Gristave

## 2016-04-27 NOTE — Telephone Encounter (Signed)
Spoke with pt who states procedure with on 04-20-16. Pt was told to start prednisone and a nasal spray. These teo rx were not called in. Pt is currently out of town and will not be home until Thursday.  DS please advise. Thanks.

## 2016-04-27 NOTE — Telephone Encounter (Signed)
Rx sent to preferred pharmacy. Pt aware & voiced understanding. Nothing further needed.  

## 2016-05-13 ENCOUNTER — Ambulatory Visit (INDEPENDENT_AMBULATORY_CARE_PROVIDER_SITE_OTHER): Payer: Medicare Other | Admitting: Internal Medicine

## 2016-05-13 ENCOUNTER — Encounter: Payer: Self-pay | Admitting: Internal Medicine

## 2016-05-13 VITALS — BP 130/84 | HR 56 | Wt 145.0 lb

## 2016-05-13 DIAGNOSIS — J1520 Pneumonia due to staphylococcus, unspecified: Secondary | ICD-10-CM

## 2016-05-13 NOTE — Progress Notes (Signed)
[facility] Consultation      Date: 05/13/2016,   MRN# [id number] Beth Beck 1946-01-23 Code Status:  Code Status History    This patient does not have a recorded code status. Please follow your organizational policy for patients in this situation.     Hosp day:_0 @ Referring MD: _1 @     PCP:      AdmissionWeight: 145 lb (65.8 kg)                 CurrentWeight: 145 lb (65.8 kg) Beth Beck is a 71 y.o. old female seen in consultation for pneumonia  at the request of Dr. Doy Hutching     CHIEF COMPLAINT:   Follow up pneumonia  HISTORY OF PRESENT ILLNESS   Follow up bronch 04/2016 Showed +staph aureus species and acute bronchtiis  Patient feels much better since last visit No resp issues at this time Has chronic nasal symptoms and is to see her ENT doc next month  No signs of infection at this time   I have exp   Current Medication:  Current Outpatient Prescriptions:  .  aspirin (BAYER ASPIRIN EC LOW DOSE) 81 MG EC tablet, Take 81 mg by mouth daily. Swallow whole., Disp: , Rfl:  .  aspirin-acetaminophen-caffeine (EXCEDRIN MIGRAINE) 250-250-65 MG tablet, Take 2 tablets by mouth daily as needed for headache., Disp: , Rfl:  .  butalbital-acetaminophen-caffeine (FIORICET) 50-325-40 MG tablet, Take 1 tablet by mouth every evening. , Disp: , Rfl:  .  CALCIUM-MAGNESIUM-ZINC PO, Take 2 tablets by mouth every morning. One tablet at bedtime, Disp: , Rfl:  .  Cyanocobalamin (VITAMIN B-12 PO), Take 2,500 mcg by mouth daily. , Disp: , Rfl:  .  esomeprazole (NEXIUM) 40 MG capsule, Take 40 mg by mouth every morning. , Disp: , Rfl:  .  estrogens, conjugated, (PREMARIN) 0.625 MG tablet, Take 0.625 mg by mouth every evening. , Disp: , Rfl:  .  fluticasone (FLONASE) 50 MCG/ACT nasal spray, Place 2 sprays into both nostrils 2 (two) times daily. , Disp: , Rfl:  .  fluticasone furoate-vilanterol (BREO ELLIPTA) 200-25 MCG/INH AEPB, Inhale 1 puff  into the lungs daily., Disp: 28 each, Rfl: 0 .  gabapentin (NEURONTIN) 300 MG capsule, Take 600 mg by mouth 2 (two) times daily., Disp: , Rfl:  .  Hypertonic Nasal Wash (SINUS RINSE BOTTLE KIT NA), Place into the nose 2 (two) times daily., Disp: , Rfl:  .  lovastatin (MEVACOR) 40 MG tablet, Take 40 mg by mouth at bedtime., Disp: , Rfl:  .  metoCLOPramide (REGLAN) 10 MG tablet, Take 10 mg by mouth every evening. , Disp: , Rfl:  .  Multiple Vitamins-Minerals (CENTRUM SILVER PO), Take 1 tablet by mouth daily. , Disp: , Rfl:  .  nadolol (CORGARD) 40 MG tablet, Take 40 mg by mouth every evening. , Disp: , Rfl:  .  naproxen (NAPROSYN) 500 MG tablet, Take 500 mg by mouth every evening. , Disp: , Rfl:  .  Probiotic Product (PROBIOTIC DAILY) CAPS, Take 1 capsule by mouth daily., Disp: , Rfl:     ALLERGIES   Bactrim [sulfamethoxazole-trimethoprim]; Clindamycin/lincomycin; Cymbalta [duloxetine hcl]; Escitalopram; Meloxicam; Sulfa antibiotics; and Tetracyclines & related     REVIEW OF SYSTEMS   Review of Systems  Constitutional: Negative for chills, diaphoresis, fever, malaise/fatigue and weight loss.  HENT: Negative for congestion and hearing loss.   Respiratory: Negative for cough, hemoptysis, sputum production, shortness of breath and wheezing.   Cardiovascular: Negative for chest  pain, palpitations and orthopnea.  Gastrointestinal: Negative for abdominal pain, heartburn, nausea and vomiting.  Skin: Negative for rash.  Neurological: Negative for weakness.  All other systems reviewed and are negative.    VS: BP 130/84 (BP Location: Left Arm, Cuff Size: Normal)   Pulse (!) 56   Wt 145 lb (65.8 kg)   SpO2 98%   BMI 23.40 kg/m      PHYSICAL EXAM  Physical Exam  Constitutional: She is oriented to person, place, and time. She appears well-developed and well-nourished. No distress.  Cardiovascular: Normal rate, regular rhythm and normal heart sounds.   No murmur heard. Pulmonary/Chest:  Effort normal and breath sounds normal. No stridor. No respiratory distress. She has no wheezes.  Musculoskeletal: Normal range of motion. She exhibits no edema.  Neurological: She is alert and oriented to person, place, and time. No cranial nerve deficit.  Skin: Skin is warm. She is not diaphoretic.  Psychiatric: She has a normal mood and affect.      ASSESSMENT/PLAN   71 yo white female with h/o chronic sinusitis with persistent sinus issues with signs and symptoms of pneumonia offically dx with Staph Pneumonia   No need for inhalers at this time Follow up with ENT next month  Follow up in 3 months  I have personally obtained a history, examined the patient, evaluated laboratory and independently reviewed imaging results, formulated the assessment and plan and placed orders.  The Patient requires high complexity decision making for assessment and support, frequent evaluation and titration of therapies, application of advanced monitoring technologies and extensive interpretation of multiple databases.    Patient  satisfied with Plan of action and management. All questions answered  Beth Beck, M.D.  [facility]  Medical Director [place] Director [facility] Cardio-Pulmonary Department

## 2016-05-13 NOTE — Patient Instructions (Signed)
Follow up in 3 months

## 2016-05-21 ENCOUNTER — Telehealth: Payer: Self-pay | Admitting: Internal Medicine

## 2016-05-21 NOTE — Telephone Encounter (Signed)
Patient was recently see by Beth Beck and now she thinks the pneumonia is back.  Patient has been coughing and has the pain in her right lung.  Please call to discuss.

## 2016-05-21 NOTE — Telephone Encounter (Signed)
Pt states on Wednesday she started feeling bad with NP cough and pin in her RT upper side. States she feels like she did when she had PNA before. Offered an [place]t for [place] in [place] today but pt denied. Scheduled pt for Monday with DR. Nothing further needed.

## 2016-05-23 NOTE — Progress Notes (Signed)
* [facility]     Assessment and Plan:  Acute bronchitis  now pleuritic chest pain.  --Cough and sputum production.   Possible pneumonia with right sided infiltrate.  --Will try course of doxycycline.   Date: 05/23/2016  MRN# [id number] Beth Beck June 15, 1945   Beth Beck is a 71 y.o. old female seen in follow up for chief complaint of  Chief Complaint  Patient presents with  . Acute Visit    sinus drainage: cough prod at times: RT side pain: chest tightness:      HPI:   The patient 71 yo white female who normally sees Dr. Mortimer Fries with h/o chronic sinusitis with persistent sinus issues with signs and symptoms of pneumonia offically dx with Staph Pneumonia-- MSSA resistant to clindamycin and erythromycin.   She recently had a bout of bronchitis, and had a course of levaquin with improvement in the course of abx. But now notes that it is coming back with some pain in her right chest and cough and mucus production which she notes is similar to her previous episode of pneumonia.  She notes the pain is present with coughing and deep breaths.   Review of CXR today shows potential rul infiltrate, but there is no previous CXR available for comparison.   Medication:   Outpatient Encounter Prescriptions as of 05/24/2016  Medication Sig  . aspirin (BAYER ASPIRIN EC LOW DOSE) 81 MG EC tablet Take 81 mg by mouth daily. Swallow whole.  Marland Kitchen aspirin-acetaminophen-caffeine (EXCEDRIN MIGRAINE) 250-250-65 MG tablet Take 2 tablets by mouth daily as needed for headache.  . butalbital-acetaminophen-caffeine (FIORICET) 50-325-40 MG tablet Take 1 tablet by mouth every evening.   Marland Kitchen CALCIUM-MAGNESIUM-ZINC PO Take 2 tablets by mouth every morning. One tablet at bedtime  . Cyanocobalamin (VITAMIN B-12 PO) Take 2,500 mcg by mouth daily.   Marland Kitchen esomeprazole (NEXIUM) 40 MG capsule Take 40 mg by mouth every morning.   . estrogens, conjugated, (PREMARIN) 0.625 MG tablet Take 0.625 mg by  mouth every evening.   . fluticasone (FLONASE) 50 MCG/ACT nasal spray Place 2 sprays into both nostrils 2 (two) times daily.   . fluticasone furoate-vilanterol (BREO ELLIPTA) 200-25 MCG/INH AEPB Inhale 1 puff into the lungs daily.  Marland Kitchen gabapentin (NEURONTIN) 300 MG capsule Take 600 mg by mouth 2 (two) times daily.  . Hypertonic Nasal Wash (SINUS RINSE BOTTLE KIT NA) Place into the nose 2 (two) times daily.  Marland Kitchen lovastatin (MEVACOR) 40 MG tablet Take 40 mg by mouth at bedtime.  . metoCLOPramide (REGLAN) 10 MG tablet Take 10 mg by mouth every evening.   . Multiple Vitamins-Minerals (CENTRUM SILVER PO) Take 1 tablet by mouth daily.   . nadolol (CORGARD) 40 MG tablet Take 40 mg by mouth every evening.   . naproxen (NAPROSYN) 500 MG tablet Take 500 mg by mouth every evening.   . Probiotic Product (PROBIOTIC DAILY) CAPS Take 1 capsule by mouth daily.   No facility-administered encounter medications on file as of 05/24/2016.      Allergies:  Bactrim [sulfamethoxazole-trimethoprim]; Clindamycin/lincomycin; Cymbalta [duloxetine hcl]; Escitalopram; Meloxicam; Sulfa antibiotics; and Tetracyclines & related  Review of Systems: Gen:  Denies  fever, sweats. HEENT: Denies blurred vision. Cvc:  No dizziness, chest pain or heaviness Resp:   Denies cough or sputum porduction. Gi: Denies swallowing difficulty, stomach pain. constipation, bowel incontinence Gu:  Denies bladder incontinence, burning urine Ext:   No Joint pain, stiffness. Skin: No skin rash, easy bruising. Endoc:  No polyuria, polydipsia.  Psych: No depression, insomnia. Other:  All other systems were reviewed and found to be negative other than what is mentioned in the HPI.   Physical Examination:   VS: BP 118/70 (BP Location: Left Arm, Cuff Size: Normal)   Pulse (!) 58   Wt 145 lb (65.8 kg)   SpO2 100%   BMI 23.40 kg/m   General Appearance: No distress  Neuro:without focal findings,  speech normal,  HEENT: PERRLA, EOM  intact. Pulmonary: normal breath sounds, No wheezing.   CardiovascularNormal S1,S2.  No m/r/g.   Abdomen: Benign, Soft, non-tender. Renal:  No costovertebral tenderness  GU:  Not performed at this time. Endoc: No evident thyromegaly, no signs of acromegaly. Skin:   warm, no rash. Extremities: normal, no cyanosis, clubbing.   LABORATORY PANEL:   CBC No results for input(s): WBC, HGB, HCT, PLT in the last 168 hours. ------------------------------------------------------------------------------------------------------------------  Chemistries  No results for input(s): NA, K, CL, CO2, GLUCOSE, BUN, CREATININE, CALCIUM, MG, AST, ALT, ALKPHOS, BILITOT in the last 168 hours.  Invalid input(s): GFRCGP ------------------------------------------------------------------------------------------------------------------  Cardiac Enzymes No results for input(s): TROPONINI in the last 168 hours. ------------------------------------------------------------  RADIOLOGY:   No results found for this or any previous visit. No results found for this or any previous visit. ------------------------------------------------------------------------------------------------------------------  Thank  you for allowing [facility] to assist in the care of your patient. Our recommendations are noted above.  Please contact us if we can be of further service.   Marda Stalker, MD.  [facility] Office Number: [phone]  Patricia Pesa, M.D.  Vilinda Boehringer, M.D.  Merton Border, M.D  05/23/2016

## 2016-05-24 ENCOUNTER — Ambulatory Visit
Admission: RE | Admit: 2016-05-24 | Discharge: 2016-05-24 | Disposition: A | Discharge status: Home / Self Care | Payer: Medicare Other | Source: Ambulatory Visit | Attending: Internal Medicine | Admitting: Internal Medicine

## 2016-05-24 ENCOUNTER — Encounter: Payer: Self-pay | Admitting: Internal Medicine

## 2016-05-24 ENCOUNTER — Ambulatory Visit (INDEPENDENT_AMBULATORY_CARE_PROVIDER_SITE_OTHER): Payer: Medicare Other | Admitting: Internal Medicine

## 2016-05-24 ENCOUNTER — Other Ambulatory Visit: Payer: Self-pay | Admitting: *Deleted

## 2016-05-24 VITALS — BP 118/70 | HR 58 | Wt 145.0 lb

## 2016-05-24 DIAGNOSIS — R918 Other nonspecific abnormal finding of lung field: Secondary | ICD-10-CM | POA: Insufficient documentation

## 2016-05-24 DIAGNOSIS — R0600 Dyspnea, unspecified: Secondary | ICD-10-CM

## 2016-05-24 DIAGNOSIS — J181 Lobar pneumonia, unspecified organism: Secondary | ICD-10-CM

## 2016-05-24 DIAGNOSIS — J984 Other disorders of lung: Secondary | ICD-10-CM | POA: Diagnosis not present

## 2016-05-24 DIAGNOSIS — J189 Pneumonia, unspecified organism: Secondary | ICD-10-CM

## 2016-05-24 MED ORDER — DOXYCYCLINE HYCLATE 100 MG PO CAPS: 100.0000 mg | ORAL_CAPSULE | Freq: Two times a day (BID) | ORAL | 0 refills | Status: DC

## 2016-05-24 NOTE — Patient Instructions (Signed)
Doxycycline 100 mg bid for 7 days

## 2016-05-24 NOTE — Addendum Note (Signed)
Addended by: Meyer CoryAHMAD, MISTY R on: 05/24/2016 01:53 PM   Modules accepted: Orders

## 2016-05-27 ENCOUNTER — Telehealth: Payer: Self-pay | Admitting: Internal Medicine

## 2016-05-27 NOTE — Telephone Encounter (Signed)
Pt states she thinks the doxycycline is giving her a headache. States each time she takes one, her headache gets worse.

## 2016-05-28 NOTE — Telephone Encounter (Signed)
Pt was seen 1/22 for possible PNA and was given Doxycycline. States she thinks it is giving her a headache. Please advise.

## 2016-05-28 NOTE — Telephone Encounter (Signed)
HA would be an unusual adverse effect from doxycycline. Based on her bronchoscopy results from December and on her CXR from 01/22, it is the best choice of antibiotics if she can tolerate it. Would try acetaminophen as needed for HA. Call back first of next week if still a problem and we can consider changing it to something else  Thanks  Theodoro Gristave

## 2016-05-28 NOTE — Telephone Encounter (Signed)
Pt informed. Nothing further needed. 

## 2016-06-03 LAB — ACID FAST CULTURE WITH REFLEXED SENSITIVITIES: ACID FAST CULTURE - AFSCU3: NEGATIVE

## 2016-06-03 LAB — ACID FAST CULTURE WITH REFLEXED SENSITIVITIES (MYCOBACTERIA)

## 2016-08-17 ENCOUNTER — Other Ambulatory Visit: Payer: Self-pay | Admitting: Surgery

## 2016-08-17 DIAGNOSIS — M75121 Complete rotator cuff tear or rupture of right shoulder, not specified as traumatic: Secondary | ICD-10-CM

## 2016-08-17 DIAGNOSIS — M7581 Other shoulder lesions, right shoulder: Secondary | ICD-10-CM

## 2016-08-24 ENCOUNTER — Ambulatory Visit (INDEPENDENT_AMBULATORY_CARE_PROVIDER_SITE_OTHER): Payer: Medicare Other | Admitting: Internal Medicine

## 2016-08-24 ENCOUNTER — Encounter: Payer: Self-pay | Admitting: Internal Medicine

## 2016-08-24 VITALS — BP 122/86 | HR 66 | Resp 16 | Ht 66.0 in | Wt 148.0 lb

## 2016-08-24 DIAGNOSIS — J320 Chronic maxillary sinusitis: Secondary | ICD-10-CM | POA: Diagnosis not present

## 2016-08-24 MED ORDER — AZELASTINE-FLUTICASONE 137-50 MCG/ACT NA SUSP: 1.0000 | Freq: Every day | NASAL | 0 refills | Status: DC

## 2016-08-24 NOTE — Patient Instructions (Signed)
Will try Dymista

## 2016-08-24 NOTE — Progress Notes (Signed)
[facility] Consultation      Date: 08/24/2016,   MRN# [id number] RONI FRIBERG 24-Jan-1968 Code Status:  Code Status History    This patient does not have a recorded code status. Please follow your organizational policy for patients in this situation.     Hosp day:_0 @ Referring MD: _1 @     PCP:      AdmissionWeight: 148 lb (67.1 kg)                 CurrentWeight: 148 lb (67.1 kg) Beth Beck is a 71 y.o. old female seen in consultation for pneumonia  at the request of Dr. Doy Hutching   Previous Bronch bronch 04/2016 Showed +staph aureus species and acute bronchtiis  CHIEF COMPLAINT:   Follow up pneumonia  HISTORY OF PRESENT ILLNESS    Patient feels much better since last visit but has chronic sinus drainage and throuat clearing She is miserable No resp issues at this time Has chronic nasal symptoms and is to see her ENT doc next month  No signs of infection at this time   I have exp   Current Medication:  Current Outpatient Prescriptions:  .  aspirin (BAYER ASPIRIN EC LOW DOSE) 81 MG EC tablet, Take 81 mg by mouth daily. Swallow whole., Disp: , Rfl:  .  aspirin-acetaminophen-caffeine (EXCEDRIN MIGRAINE) 250-250-65 MG tablet, Take 2 tablets by mouth daily as needed for headache., Disp: , Rfl:  .  butalbital-acetaminophen-caffeine (FIORICET) 50-325-40 MG tablet, Take 1 tablet by mouth every evening. , Disp: , Rfl:  .  CALCIUM-MAGNESIUM-ZINC PO, Take 2 tablets by mouth every morning. One tablet at bedtime, Disp: , Rfl:  .  Cyanocobalamin (VITAMIN B-12 PO), Take 2,500 mcg by mouth daily. , Disp: , Rfl:  .  esomeprazole (NEXIUM) 40 MG capsule, Take 40 mg by mouth every morning. , Disp: , Rfl:  .  estrogens, conjugated, (PREMARIN) 0.625 MG tablet, Take 0.625 mg by mouth every evening. , Disp: , Rfl:  .  fluticasone (FLONASE) 50 MCG/ACT nasal spray, Place 2 sprays into both nostrils 2 (two) times daily. , Disp: , Rfl:  .   gabapentin (NEURONTIN) 300 MG capsule, Take 600 mg by mouth 2 (two) times daily., Disp: , Rfl:  .  Hypertonic Nasal Wash (SINUS RINSE BOTTLE KIT NA), Place into the nose 2 (two) times daily., Disp: , Rfl:  .  lovastatin (MEVACOR) 40 MG tablet, Take 40 mg by mouth at bedtime., Disp: , Rfl:  .  metoCLOPramide (REGLAN) 10 MG tablet, Take 10 mg by mouth every evening. , Disp: , Rfl:  .  Multiple Vitamins-Minerals (CENTRUM SILVER PO), Take 1 tablet by mouth daily. , Disp: , Rfl:  .  nadolol (CORGARD) 40 MG tablet, Take 40 mg by mouth every evening. , Disp: , Rfl:  .  naproxen (NAPROSYN) 500 MG tablet, Take 500 mg by mouth every evening. , Disp: , Rfl:  .  Probiotic Product (PROBIOTIC DAILY) CAPS, Take 1 capsule by mouth daily., Disp: , Rfl:     ALLERGIES   Bactrim [sulfamethoxazole-trimethoprim]; Clindamycin/lincomycin; Cymbalta [duloxetine hcl]; Escitalopram; Meloxicam; Sulfa antibiotics; and Tetracyclines & related     REVIEW OF SYSTEMS   Review of Systems  Constitutional: Negative for chills, diaphoresis, fever, malaise/fatigue and weight loss.  HENT: Negative for congestion and hearing loss.   Respiratory: Negative for cough, hemoptysis, sputum production, shortness of breath and wheezing.   Cardiovascular: Negative for chest pain, palpitations and orthopnea.  Gastrointestinal: Negative for abdominal pain, heartburn,  nausea and vomiting.  Skin: Negative for rash.  Neurological: Negative for weakness.  All other systems reviewed and are negative.    VS: BP 122/86 (BP Location: Left Arm, Patient Position: Sitting, Cuff Size: Normal)   Pulse 66   Resp 16   Ht 5' 6" (1.676 m)   Wt 148 lb (67.1 kg)   SpO2 100%   BMI 23.89 kg/m      PHYSICAL EXAM  Physical Exam  Constitutional: She is oriented to person, place, and time. She appears well-developed and well-nourished. No distress.  Cardiovascular: Normal rate, regular rhythm and normal heart sounds.   No murmur  heard. Pulmonary/Chest: Effort normal and breath sounds normal. No stridor. No respiratory distress. She has no wheezes.  Musculoskeletal: Normal range of motion. She exhibits no edema.  Neurological: She is alert and oriented to person, place, and time. No cranial nerve deficit.  Skin: Skin is warm. She is not diaphoretic.  Psychiatric: She has a normal mood and affect.      ASSESSMENT/PLAN   71 yo white female with h/o chronic sinusitis with persistent sinus issues and drainage without signs and symptoms of pneumonia offically dx with Staph Pneumonia   No need for inhalers at this time Will try Dymista nasal sprays and hope for the best Follow up with ENT next month  Follow up in 6 months    Patient  satisfied with Plan of action and management. All questions answered  Corrin Parker, M.D.  [facility]  Medical Director [place] Director [facility] Cardio-Pulmonary Department

## 2016-08-24 NOTE — Addendum Note (Signed)
Addended by: Alease Frame on: 08/24/2016 11:50 AM   Modules accepted: Orders

## 2016-08-26 ENCOUNTER — Ambulatory Visit
Admission: RE | Admit: 2016-08-26 | Discharge: 2016-08-26 | Disposition: A | Discharge status: Home / Self Care | Payer: Medicare Other | Source: Ambulatory Visit | Attending: Surgery | Admitting: Surgery

## 2016-08-26 DIAGNOSIS — M7581 Other shoulder lesions, right shoulder: Secondary | ICD-10-CM | POA: Insufficient documentation

## 2016-08-26 DIAGNOSIS — Z9889 Other specified postprocedural states: Secondary | ICD-10-CM | POA: Diagnosis not present

## 2016-08-26 DIAGNOSIS — M75121 Complete rotator cuff tear or rupture of right shoulder, not specified as traumatic: Secondary | ICD-10-CM | POA: Diagnosis present

## 2016-08-26 DIAGNOSIS — M7551 Bursitis of right shoulder: Secondary | ICD-10-CM | POA: Insufficient documentation

## 2016-09-28 ENCOUNTER — Encounter
Admission: RE | Admit: 2016-09-28 | Discharge: 2016-09-28 | Disposition: A | Discharge status: Home / Self Care | Payer: Medicare Other | Source: Ambulatory Visit | Attending: Surgery | Admitting: Surgery

## 2016-09-28 DIAGNOSIS — M7581 Other shoulder lesions, right shoulder: Secondary | ICD-10-CM | POA: Insufficient documentation

## 2016-09-28 DIAGNOSIS — Z01810 Encounter for preprocedural cardiovascular examination: Secondary | ICD-10-CM | POA: Diagnosis not present

## 2016-09-28 DIAGNOSIS — M75121 Complete rotator cuff tear or rupture of right shoulder, not specified as traumatic: Secondary | ICD-10-CM | POA: Diagnosis not present

## 2016-09-28 DIAGNOSIS — Z01818 Encounter for other preprocedural examination: Secondary | ICD-10-CM | POA: Diagnosis present

## 2016-09-28 DIAGNOSIS — R001 Bradycardia, unspecified: Secondary | ICD-10-CM | POA: Insufficient documentation

## 2016-09-28 HISTORY — DX: Pure hypercholesterolemia, unspecified: E78.00

## 2016-09-28 LAB — SURGICAL PCR SCREEN
MRSA, PCR: NEGATIVE
Staphylococcus aureus: POSITIVE — AB

## 2016-09-28 NOTE — Patient Instructions (Signed)
Your procedure is scheduled on: October 05, 2016 (Tuesday)   Report to Same Day Surgery 2nd floor medical mall [facility] Entrance-take elevator on left to 2nd floor.  Check in with surgery information desk.) To find out your arrival time please call [phone] between 1PM - 3PM on October 04, 2016 (Monday)   Remember: Instructions that are not followed completely may result in serious medical risk, up to and including death, or upon the discretion of your surgeon and anesthesiologist your surgery may need to be rescheduled.    _x___ 1. Do not eat food or drink liquids after midnight. No gum chewing or hard candies                               __x__ 2. No Alcohol for 24 hours before or after surgery.   __x__3. No Smoking for 24 prior to surgery.   ____  4. Bring all medications with you on the day of surgery if instructed.    __x__ 5. Notify your doctor if there is any change in your medical condition     (cold, fever, infections).     Do not wear jewelry, make-up, hairpins, clips or nail polish.  Do not wear lotions, powders, or perfumes. You may wear deodorant.  Do not shave 48 hours prior to surgery. Men may shave face and neck.  Do not bring valuables to the hospital.    [facility] is not responsible for any belongings or valuables.               Contacts, dentures or bridgework may not be worn into surgery.  Leave your suitcase in the car. After surgery it may be brought to your room.  For patients admitted to the hospital, discharge time is determined by your treatment team                         Patients discharged the day of surgery will not be allowed to drive home.  You will need someone to drive you home and stay with you the night of your procedure.    Please read over the following fact sheets that you were given:   [facility] Preparing for Surgery and or MRSA Information   _x___ Take the following medications the morning of surgery with a sip of water: 1. Nexium   (Nexium at bedtime on June 4 ) 2. Gabapentin  ____Fleets enema or Magnesium Citrate as directed.   _x___ Use CHG Soap or sage wipes as directed on instruction sheet   ____ Use inhalers on the day of surgery and bring to hospital day of surgery  ____ Stop Metformin and Janumet 2 days prior to surgery.    ____ Take 1/2 of usual insulin dose the night before surgery and none on the morning surgery  _x___ Follow recommendations from Cardiologist, Pulmonologist or PCP regarding          stopping Aspirin, Coumadin, Pllavix ,Eliquis, Effient, or Pradaxa, and Pletal. (STOP ASPIRIN TODAY)  X____Stop Anti-inflammatories such as Advil, Aleve, Ibuprofen, Motrin, Naproxen, Naprosyn, Goodies powders or aspirin products. OK to take Tylenol and     (STOP EXCEDRIN AND NAPROXEN TODAY )                      _x___ Stop supplements until after surgery.  But may continue Vitamin D, Vitamin B,  and  multivitamin      ____ Bring C-Pap to the hospital.

## 2016-10-05 ENCOUNTER — Ambulatory Visit: Payer: Medicare Other | Admitting: Anesthesiology

## 2016-10-05 ENCOUNTER — Encounter
Admission: RE | Disposition: A | Discharge status: Home / Self Care | Payer: Self-pay | Source: Ambulatory Visit | Attending: Surgery

## 2016-10-05 ENCOUNTER — Encounter: Payer: Self-pay | Admitting: Anesthesiology

## 2016-10-05 ENCOUNTER — Ambulatory Visit
Admission: RE | Admit: 2016-10-05 | Discharge: 2016-10-05 | Disposition: A | Discharge status: Home / Self Care | Payer: Medicare Other | Source: Ambulatory Visit | Attending: Surgery | Admitting: Surgery

## 2016-10-05 DIAGNOSIS — K219 Gastro-esophageal reflux disease without esophagitis: Secondary | ICD-10-CM | POA: Insufficient documentation

## 2016-10-05 DIAGNOSIS — G4730 Sleep apnea, unspecified: Secondary | ICD-10-CM | POA: Diagnosis not present

## 2016-10-05 DIAGNOSIS — Z7952 Long term (current) use of systemic steroids: Secondary | ICD-10-CM | POA: Diagnosis not present

## 2016-10-05 DIAGNOSIS — Z79899 Other long term (current) drug therapy: Secondary | ICD-10-CM | POA: Insufficient documentation

## 2016-10-05 DIAGNOSIS — E785 Hyperlipidemia, unspecified: Secondary | ICD-10-CM | POA: Insufficient documentation

## 2016-10-05 DIAGNOSIS — E7800 Pure hypercholesterolemia, unspecified: Secondary | ICD-10-CM | POA: Insufficient documentation

## 2016-10-05 DIAGNOSIS — N189 Chronic kidney disease, unspecified: Secondary | ICD-10-CM | POA: Insufficient documentation

## 2016-10-05 DIAGNOSIS — F419 Anxiety disorder, unspecified: Secondary | ICD-10-CM | POA: Diagnosis not present

## 2016-10-05 DIAGNOSIS — M7541 Impingement syndrome of right shoulder: Secondary | ICD-10-CM | POA: Insufficient documentation

## 2016-10-05 DIAGNOSIS — M795 Residual foreign body in soft tissue: Secondary | ICD-10-CM | POA: Insufficient documentation

## 2016-10-05 DIAGNOSIS — Z882 Allergy status to sulfonamides status: Secondary | ICD-10-CM | POA: Insufficient documentation

## 2016-10-05 HISTORY — PX: SHOULDER ARTHROSCOPY WITH OPEN ROTATOR CUFF REPAIR: SHX6092

## 2016-10-05 HISTORY — DX: Failed or difficult intubation, initial encounter: T88.4XXA

## 2016-10-05 SURGERY — ARTHROSCOPY, SHOULDER WITH REPAIR, ROTATOR CUFF, OPEN
Anesthesia: General | Site: Shoulder | Laterality: Right | Wound class: Clean

## 2016-10-05 MED ORDER — EPINEPHRINE PF 1 MG/ML IJ SOLN
INTRAMUSCULAR | Status: AC
  Filled 2016-10-05: qty 2

## 2016-10-05 MED ORDER — FENTANYL CITRATE (PF) 100 MCG/2ML IJ SOLN: 25.0000 ug | INTRAMUSCULAR | Status: DC | PRN

## 2016-10-05 MED ORDER — LACTATED RINGERS IV SOLN
INTRAVENOUS | Status: DC
  Administered 2016-10-05 (×2): via INTRAVENOUS

## 2016-10-05 MED ORDER — ROPIVACAINE HCL 5 MG/ML IJ SOLN
INTRAMUSCULAR | Status: DC | PRN
  Administered 2016-10-05: 20 mL via PERINEURAL
  Administered 2016-10-05: 10 mL via PERINEURAL

## 2016-10-05 MED ORDER — OXYCODONE HCL 5 MG PO TABS: 5.0000 mg | ORAL_TABLET | ORAL | 0 refills | Status: DC | PRN

## 2016-10-05 MED ORDER — LIDOCAINE HCL (PF) 1 % IJ SOLN
INTRAMUSCULAR | Status: DC | PRN
  Administered 2016-10-05: 1 mL via INTRADERMAL

## 2016-10-05 MED ORDER — ONDANSETRON HCL 4 MG/2ML IJ SOLN
INTRAMUSCULAR | Status: DC | PRN
  Administered 2016-10-05: 4 mg via INTRAVENOUS

## 2016-10-05 MED ORDER — PROPOFOL 10 MG/ML IV BOLUS
INTRAVENOUS | Status: AC
  Filled 2016-10-05: qty 20

## 2016-10-05 MED ORDER — SCOPOLAMINE 1 MG/3DAYS TD PT72
MEDICATED_PATCH | TRANSDERMAL | Status: AC
  Filled 2016-10-05: qty 1

## 2016-10-05 MED ORDER — EPHEDRINE SULFATE 50 MG/ML IJ SOLN
INTRAMUSCULAR | Status: DC | PRN
  Administered 2016-10-05: 5 mg via INTRAVENOUS
  Administered 2016-10-05: 20 mg via INTRAVENOUS

## 2016-10-05 MED ORDER — LIDOCAINE HCL (CARDIAC) 20 MG/ML IV SOLN
INTRAVENOUS | Status: DC | PRN
  Administered 2016-10-05: 100 mg via INTRAVENOUS

## 2016-10-05 MED ORDER — EPHEDRINE SULFATE 50 MG/ML IJ SOLN
INTRAMUSCULAR | Status: AC
  Filled 2016-10-05: qty 1

## 2016-10-05 MED ORDER — ROCURONIUM BROMIDE 100 MG/10ML IV SOLN
INTRAVENOUS | Status: DC | PRN
  Administered 2016-10-05: 30 mg via INTRAVENOUS

## 2016-10-05 MED ORDER — ONDANSETRON HCL 4 MG/2ML IJ SOLN
INTRAMUSCULAR | Status: AC
  Filled 2016-10-05: qty 2

## 2016-10-05 MED ORDER — LIDOCAINE HCL (PF) 1 % IJ SOLN
INTRAMUSCULAR | Status: AC
  Filled 2016-10-05: qty 5

## 2016-10-05 MED ORDER — BUPIVACAINE-EPINEPHRINE 0.5% -1:200000 IJ SOLN
INTRAMUSCULAR | Status: DC | PRN
  Administered 2016-10-05: 30 mL

## 2016-10-05 MED ORDER — MIDAZOLAM HCL 2 MG/2ML IJ SOLN
INTRAMUSCULAR | Status: AC
  Filled 2016-10-05: qty 2

## 2016-10-05 MED ORDER — OXYCODONE HCL 5 MG/5ML PO SOLN: 5.0000 mg | Freq: Once | ORAL | Status: DC | PRN

## 2016-10-05 MED ORDER — OXYCODONE HCL 5 MG PO TABS: 5.0000 mg | ORAL_TABLET | Freq: Once | ORAL | Status: DC | PRN

## 2016-10-05 MED ORDER — BUPIVACAINE-EPINEPHRINE (PF) 0.5% -1:200000 IJ SOLN
INTRAMUSCULAR | Status: AC
  Filled 2016-10-05: qty 30

## 2016-10-05 MED ORDER — PROPOFOL 10 MG/ML IV BOLUS
INTRAVENOUS | Status: DC | PRN
  Administered 2016-10-05: 120 mg via INTRAVENOUS

## 2016-10-05 MED ORDER — DIPHENHYDRAMINE HCL 50 MG/ML IJ SOLN
12.5000 mg | Freq: Once | INTRAMUSCULAR | Status: AC
  Administered 2016-10-05: 12.5 mg via INTRAVENOUS

## 2016-10-05 MED ORDER — FENTANYL CITRATE (PF) 250 MCG/5ML IJ SOLN
INTRAMUSCULAR | Status: AC
  Filled 2016-10-05: qty 5

## 2016-10-05 MED ORDER — LACTATED RINGERS IV SOLN
INTRAVENOUS | Status: DC | PRN
  Administered 2016-10-05: 2 mL

## 2016-10-05 MED ORDER — ROCURONIUM BROMIDE 50 MG/5ML IV SOLN
INTRAVENOUS | Status: AC
  Filled 2016-10-05: qty 1

## 2016-10-05 MED ORDER — VANCOMYCIN HCL IN DEXTROSE 1-5 GM/200ML-% IV SOLN
INTRAVENOUS | Status: AC
  Filled 2016-10-05: qty 200

## 2016-10-05 MED ORDER — VANCOMYCIN HCL IN DEXTROSE 1-5 GM/200ML-% IV SOLN
1000.0000 mg | Freq: Once | INTRAVENOUS | Status: AC
  Administered 2016-10-05: 1000 mg via INTRAVENOUS

## 2016-10-05 MED ORDER — DIPHENHYDRAMINE HCL 50 MG/ML IJ SOLN
INTRAMUSCULAR | Status: AC
  Filled 2016-10-05: qty 1

## 2016-10-05 MED ORDER — ROPIVACAINE HCL 5 MG/ML IJ SOLN
INTRAMUSCULAR | Status: AC
  Filled 2016-10-05: qty 30

## 2016-10-05 MED ORDER — MIDAZOLAM HCL 2 MG/2ML IJ SOLN
1.0000 mg | Freq: Once | INTRAMUSCULAR | Status: AC
  Administered 2016-10-05: 1 mg via INTRAVENOUS

## 2016-10-05 MED ORDER — GLYCOPYRROLATE 0.2 MG/ML IJ SOLN
INTRAMUSCULAR | Status: DC | PRN
  Administered 2016-10-05: 0.2 mg via INTRAVENOUS

## 2016-10-05 MED ORDER — SUGAMMADEX SODIUM 500 MG/5ML IV SOLN
INTRAVENOUS | Status: DC | PRN
  Administered 2016-10-05: 120 mg via INTRAVENOUS

## 2016-10-05 MED ORDER — SCOPOLAMINE 1 MG/3DAYS TD PT72
1.0000 | MEDICATED_PATCH | TRANSDERMAL | Status: DC
  Administered 2016-10-05: 1.5 mg via TRANSDERMAL

## 2016-10-05 MED ORDER — FENTANYL CITRATE (PF) 100 MCG/2ML IJ SOLN
50.0000 ug | Freq: Once | INTRAMUSCULAR | Status: AC
  Administered 2016-10-05: 50 ug via INTRAVENOUS

## 2016-10-05 MED ORDER — SUCCINYLCHOLINE CHLORIDE 20 MG/ML IJ SOLN
INTRAMUSCULAR | Status: DC | PRN
  Administered 2016-10-05: 80 mg via INTRAVENOUS

## 2016-10-05 MED ORDER — LIDOCAINE HCL (PF) 2 % IJ SOLN
INTRAMUSCULAR | Status: AC
  Filled 2016-10-05: qty 2

## 2016-10-05 MED ORDER — SUGAMMADEX SODIUM 200 MG/2ML IV SOLN
INTRAVENOUS | Status: AC
  Filled 2016-10-05: qty 2

## 2016-10-05 MED ORDER — FENTANYL CITRATE (PF) 100 MCG/2ML IJ SOLN
INTRAMUSCULAR | Status: DC | PRN
  Administered 2016-10-05: 100 ug via INTRAVENOUS
  Administered 2016-10-05: 50 ug via INTRAVENOUS

## 2016-10-05 MED ORDER — FENTANYL CITRATE (PF) 100 MCG/2ML IJ SOLN
INTRAMUSCULAR | Status: AC
  Filled 2016-10-05: qty 2

## 2016-10-05 SURGICAL SUPPLY — 47 items
ANCH SUT 2 2.9 2 LD TPR NDL (Anchor) ×1 IMPLANT
ANCHOR JUGGERKNOT WTAP NDL 2.9 (Anchor) ×2 IMPLANT
BIT DRILL JUGRKNT W/NDL BIT2.9 (DRILL) IMPLANT
BLADE FULL RADIUS 3.5 (BLADE) ×3 IMPLANT
BUR ACROMIONIZER 4.0 (BURR) ×1 IMPLANT
CANNULA SHAVER 8MMX76MM (CANNULA) ×3 IMPLANT
CHLORAPREP W/TINT 26ML (MISCELLANEOUS) ×3 IMPLANT
COVER MAYO STAND STRL (DRAPES) ×3 IMPLANT
DRAPE IMP U-DRAPE 54X76 (DRAPES) ×6 IMPLANT
DRILL JUGGERKNOT W/NDL BIT 2.9 (DRILL) ×3
DRSG OPSITE POSTOP 4X8 (GAUZE/BANDAGES/DRESSINGS) ×3 IMPLANT
ELECT REM PT RETURN 9FT ADLT (ELECTROSURGICAL) ×3
ELECTRODE REM PT RTRN 9FT ADLT (ELECTROSURGICAL) ×1 IMPLANT
GAUZE PETRO XEROFOAM 1X8 (MISCELLANEOUS) ×3 IMPLANT
GAUZE SPONGE 4X4 12PLY STRL (GAUZE/BANDAGES/DRESSINGS) ×3 IMPLANT
GLOVE BIO SURGEON STRL SZ7.5 (GLOVE) ×6 IMPLANT
GLOVE BIO SURGEON STRL SZ8 (GLOVE) ×6 IMPLANT
GLOVE BIOGEL PI IND STRL 8 (GLOVE) ×1 IMPLANT
GLOVE BIOGEL PI INDICATOR 8 (GLOVE) ×2
GLOVE INDICATOR 8.0 STRL GRN (GLOVE) ×3 IMPLANT
GOWN STRL REUS W/ TWL LRG LVL3 (GOWN DISPOSABLE) ×1 IMPLANT
GOWN STRL REUS W/ TWL XL LVL3 (GOWN DISPOSABLE) ×1 IMPLANT
GOWN STRL REUS W/TWL LRG LVL3 (GOWN DISPOSABLE) ×3
GOWN STRL REUS W/TWL XL LVL3 (GOWN DISPOSABLE) ×3
GRASPER SUT 15 45D LOW PRO (SUTURE) IMPLANT
IV LACTATED RINGER IRRG 3000ML (IV SOLUTION) ×6
IV LR IRRIG 3000ML ARTHROMATIC (IV SOLUTION) ×2 IMPLANT
MANIFOLD NEPTUNE II (INSTRUMENTS) ×3 IMPLANT
MASK FACE SPIDER DISP (MASK) ×3 IMPLANT
MAT BLUE FLOOR 46X72 FLO (MISCELLANEOUS) ×3 IMPLANT
NDL MAYO CATGUT SZ5 (NEEDLE)
NDL REVERSE CUT 1/2 CRC (NEEDLE) IMPLANT
NDL SUT 5 .5 CRC TPR PNT MAYO (NEEDLE) IMPLANT
NEEDLE REVERSE CUT 1/2 CRC (NEEDLE) IMPLANT
PACK ARTHROSCOPY SHOULDER (MISCELLANEOUS) ×3 IMPLANT
SLING ARM LRG DEEP (SOFTGOODS) ×1 IMPLANT
SLING ULTRA II LG (MISCELLANEOUS) ×3 IMPLANT
STAPLER SKIN PROX 35W (STAPLE) ×3 IMPLANT
STRAP SAFETY BODY (MISCELLANEOUS) ×3 IMPLANT
SUT ETHIBOND 0 MO6 C/R (SUTURE) ×3 IMPLANT
SUT VIC AB 2-0 CT1 27 (SUTURE) ×6
SUT VIC AB 2-0 CT1 TAPERPNT 27 (SUTURE) ×2 IMPLANT
TAPE MICROFOAM 4IN (TAPE) ×3 IMPLANT
TUBING ARTHRO INFLOW-ONLY STRL (TUBING) ×3 IMPLANT
TUBING CONNECTING 10 (TUBING) ×2 IMPLANT
TUBING CONNECTING 10' (TUBING) ×1
WAND HAND CNTRL MULTIVAC 90 (MISCELLANEOUS) ×3 IMPLANT

## 2016-10-05 NOTE — Anesthesia Post-op Follow-up Note (Cosign Needed)
Anesthesia QCDR form completed.        

## 2016-10-05 NOTE — OR Nursing (Addendum)
Upon arrival to post-op, pt voided qs, reports she feels she has emptied her bladder. Immobilizer on right upper extremity and ice to right shoulder. Right fingers cap refill WNL, finger pink and warm to touch, right radial pulse palpated.

## 2016-10-05 NOTE — H&P (Signed)
Paper H&P to be scanned into permanent record. H&P reviewed and patient re-examined. No changes. 

## 2016-10-05 NOTE — Op Note (Signed)
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  Post-Op Diagnosis: Impingement/tendinopathy with recurrent rotator cuff tear and and loose retained foreign body (suture anchor), right shoulder.  Procedure: Extensive arthroscopic debridement, mini-open rotator cuff repair, and removal of loose prominent retained suture anchor, right shoulder.  Anesthesia: GET with interscalene block placed preoperatively by the anesthesiologist.  Surgeon:   Maryagnes Amos, MD  Assistant:   None  Findings: As above. There was a small recurrent tear involving the anterior insertional fibers of the supraspinatus tendon without retraction. The remainder the rotator cuff was in excellent condition. The patient is status post a biceps tenotomy. The articular surfaces of the glenoid and humerus both were in satisfactory condition other than a small focal area of grade 2 chondromalacia involving the anterior-superior aspect of the humeral head. The labrum demonstrated moderate fraying anterosuperiorly, but otherwise was in satisfactory condition and without detachment from the glenoid rim. The retained suture anchor used for a prior rotator cuff repair was noted to be approximate 5 mm proud and causing significant inflammation/irritation in the subacromial space.  Complications: None  Fluids:   1000 cc  Estimated blood loss: 5 cc  Tourniquet time: None  Drains: None  Closure: Staples   Brief clinical note: The patient is a 71 year old female with a several month history of recurrent right shoulder pain. She is status post a prior arthroscopic rotator cuff repair and subsequent arthroscopic debridement with removal of a retained suture anchor approximate 1 year ago. Several months ago, she began to complain of increased pain and catching in her shoulder. The patient's symptoms  have progressed despite medications, activity modification, etc. The patient's history and examination are consistent with impingement/tendinopathy with a possible recurrent rotator cuff tear. A subsequent MRI scan was inconclusive for a recurrent rotator cuff tear, but did demonstrate that one of the previously placed PEEK suture anchors had backed out of the greater tuberosity and was sitting approximately 5 mm proud. The patient presents at this time for definitive management of these shoulder symptoms.  Procedure: The patient underwent placement of an interscalene block by the anesthesiologist in the preoperative holding area before being brought into the operating room and lain in the supine position. The patient then underwent general endotracheal intubation and anesthesia before being repositioned in the beach chair position using the beach chair positioner. The right shoulder and upper extremity were prepped with ChloraPrep solution before being draped sterilely. Preoperative antibiotics were administered. A timeout was performed to confirm the proper surgical site before the expected portal sites and incision site were injected with 0.5% Sensorcaine with epinephrine. A posterior portal was created and the glenohumeral joint thoroughly inspected with the findings as described above. An anterior portal was created using an outside-in technique. The labrum and rotator cuff were further probed, again confirming the above-noted findings. The areas of labral fraying were debrided back to stable margins using the full-radius resector. In addition, a focal area of grade 2 chondromalacia was debrided back to stable margins, as were areas of synovitis. Finally, the retained suture material within the joint in the area of recurrent tearing involving the anterior insertional fibers of the supraspinatus also were debrided back to stable margins using the full-radius resector. The ArthroCare wand was inserted and  used to obtain hemostasis as well as to "anneal" the labrum superiorly and anteriorly. The instruments were removed from the joint after  suctioning the excess fluid.  The camera was repositioned through the posterior portal into the subacromial space. A separate lateral portal was created using an outside-in technique. The 3.5 mm full-radius resector was introduced and used to perform a subtotal bursectomy. The ArthroCare wand was then inserted and used to remove the periosteal tissue off the undersurface of the anterior third of the acromion as well as to debride areas of synovitis along the lateral margin of the acromion directly over the prominent portion of the retained suture anchor. In addition, there were multiple subacromial bursal adhesions which were released as well. The instruments were then removed from the subacromial space after suctioning the excess fluid.  Utilizing the previous surgical incision, an approximately 3-4 cm incision was made over the anterolateral aspect of the shoulder beginning at the anterolateral corner of the acromion and extending distally in line with the bicipital groove. This incision was carried down through the subcutaneous tissues to expose the deltoid fascia. The raphae between the anterior and middle thirds was identified and this plane developed to provide access into the subacromial space. Additional bursal adhesions were released using a small Chandler retractor, and additional bursal tissues were debrided sharply using Metzenbaum scissors. The retained suture anchor was readily identified and removed using a rongeur. The entire anchor was able to be removed. The small rotator cuff defect was then repaired using a #0 Ethibond suture placed in a side to side fashion. An apparent watertight closure was obtained.  The small rotator cuff tear involving the anterior insertional fibers of the supraspinatus tendon was readily identified. The margins were debrided  sharply with a #15 blade, removing several retained sutures in the process, and the exposed greater tuberosity roughened with a rongeur. The tear was repaired using a single Biomet 2.9 mm JuggerKnot anchor. The sutures were passed so as to perform a side-to-side repair of the recurrent tendon defect. An apparent watertight closure was obtained.  The wound was copiously irrigated with sterile saline solution before the deltoid raphae was reapproximated using 2-0 Vicryl interrupted sutures. The subcutaneous tissues were closed in two layers using 2-0 Vicryl interrupted sutures before the skin was closed using staples. The portal sites also were closed using staples. A sterile bulky dressing was applied to the shoulder before the arm was placed into a shoulder immobilizer. The patient was then awakened, extubated, and returned to the recovery room in satisfactory condition after tolerating the procedure well.

## 2016-10-05 NOTE — Anesthesia Preprocedure Evaluation (Signed)
Anesthesia Evaluation  Patient identified by MRN, date of birth, ID band Patient awake    Reviewed: Allergy & Precautions, H&P , NPO status , Patient's Chart, lab work & pertinent test results  History of Anesthesia Complications (+) PONV and history of anesthetic complications  Airway Mallampati: III  TM Distance: <3 FB Neck ROM: limited    Dental  (+) Chipped, Caps, Poor Dentition   Pulmonary neg shortness of breath, sleep apnea ,    Pulmonary exam normal breath sounds clear to auscultation       Cardiovascular Exercise Tolerance: Good (-) angina(-) Past MI and (-) DOE negative cardio ROS Normal cardiovascular exam Rhythm:regular Rate:Normal     Neuro/Psych  Headaches, Anxiety  Neuromuscular disease negative psych ROS   GI/Hepatic Neg liver ROS, GERD  Controlled and Medicated,  Endo/Other  negative endocrine ROS  Renal/GU Renal disease     Musculoskeletal   Abdominal   Peds  Hematology negative hematology ROS (+)   Anesthesia Other Findings Past Medical History: No date: Anemia No date: Anxiety No date: Chronic kidney disease     Comment: History Of UTI No date: GERD (gastroesophageal reflux disease) No date: Headache     Comment: migraines - none recently, on medicine regimen No date: Hypercholesteremia No date: Irritable bowel syndrome (IBS) No date: Neuromuscular disorder (HCC)     Comment: Right arm weakness s/p RCR surg x2 No date: PONV (postoperative nausea and vomiting) No date: Post herpetic neuralgia     Comment: right rib cage. shingles approx 7 yrs ago No date: Shingles     Comment: approx 7 yrs ago - right ribs No date: Sinus congestion No date: Sleep apnea     Comment: fixed with UPPP surgery  Past Surgical History: No date: ABDOMINAL HYSTERECTOMY 10+yrs ago: BREAST BIOPSY Left     Comment: neg. No date: CHOLECYSTECTOMY 02/18/2015: ETHMOIDECTOMY Bilateral     Comment: Procedure:  BILATERAL TOTAL ETHMOIDECTOMY;                Surgeon: Geanie LoganPaul Bennett, MD;  [facility];  Service: ENT;  Laterality:               Bilateral; 04/20/2016: FLEXIBLE BRONCHOSCOPY N/A     Comment: Procedure: FLEXIBLE BRONCHOSCOPY;  Surgeon:               Merwyn Katosavid B Simonds, MD;  Location: [facility];                Service: Pulmonary;  Laterality: N/A; 02/18/2015: FRONTAL SINUS EXPLORATION Bilateral     Comment: Procedure: BILATERAL FRONTAL RECESS SINUS               EXPLORATION;  Surgeon: Geanie LoganPaul Bennett, MD;                [facility];  Service: ENT;                Laterality: Bilateral; 02/18/2015: IMAGE GUIDED SINUS SURGERY N/A     Comment: Procedure: IMAGE GUIDED SINUS SURGERY;                Surgeon: Geanie LoganPaul Bennett, MD;  [facility];  Service: ENT;  Laterality: N/A;               GAVE DISK TO CE CE 02/18/2015:  MAXILLARY ANTROSTOMY     Comment: Procedure: MAXILLARY ANTROSTOMY right side               with tissue removal.;  Surgeon: Geanie Logan,               MD;  Location: [facility];  Service:               ENT;; May 23, 2015: NASAL SINUS SURGERY     Comment: Dr. Juleen China, [facility] 07/29/2015: SHOULDER ARTHROSCOPY Right     Comment: Procedure:  right shoulder arthroscopy,               debridement of frayed labrium, excision of               retained anchor;  Surgeon: Christena Flake, MD;                Location: [facility];  Service: Orthopedics;                Laterality: Right; 02/18/2015: SPHENOIDECTOMY Bilateral     Comment: Procedure: REVISION BILATERAL SPHENOIDECTOMY;               Surgeon: Geanie Logan, MD;  Location: [facility];  Service: ENT;  Laterality:               Bilateral; No date: TONSILLECTOMY No date: TUBAL LIGATION No date: UVULOPALATOPHARYNGOPLASTY  BMI    Body Mass Index:  23.89 kg/m      Reproductive/Obstetrics negative OB ROS                              Anesthesia Physical Anesthesia Plan  ASA: III  Anesthesia Plan: General ETT   Post-op Pain Management:    Induction: Intravenous  PONV Risk Score and Plan: 4 or greater and Ondansetron, Dexamethasone, Propofol, Midazolam, Scopolamine patch - Pre-op and Treatment may vary due to age  Airway Management Planned: Oral ETT  Additional Equipment:   Intra-op Plan:   Post-operative Plan: Extubation in OR  Informed Consent: I have reviewed the patients History and Physical, chart, labs and discussed the procedure including the risks, benefits and alternatives for the proposed anesthesia with the patient or authorized representative who has indicated his/her understanding and acceptance.   Dental Advisory Given  Plan Discussed with: Anesthesiologist, CRNA and Surgeon  Anesthesia Plan Comments: (Patient had many questions about nerve block.  Answered patients questions.  Patient consented and patient voiced understand of risks including but not limited to nerve damage.  Patient consented for risks of anesthesia including but not limited to:  - adverse reactions to medications - damage to teeth, lips or other oral mucosa - sore throat or hoarseness - Damage to heart, brain, lungs or loss of life  Patient voiced understanding.)        Anesthesia Quick Evaluation

## 2016-10-05 NOTE — Anesthesia Procedure Notes (Signed)
Procedure Name: Intubation Date/Time: 10/05/2016 12:54 PM Performed by: Rosaria Ferries, Jastin Fore Pre-anesthesia Checklist: Patient identified, Emergency Drugs available, Suction available and Patient being monitored Patient Re-evaluated:Patient Re-evaluated prior to inductionOxygen Delivery Method: Circle system utilized Preoxygenation: Pre-oxygenation with 100% oxygen Intubation Type: IV induction Laryngoscope Size: Mac and 3 Grade View: Grade III Tube size: 7.0 mm Number of attempts: 1 Airway Equipment and Method: Bougie stylet Placement Confirmation: ETT inserted through vocal cords under direct vision,  positive ETCO2 and breath sounds checked- equal and bilateral Secured at: 21 cm Tube secured with: Tape Dental Injury: Teeth and Oropharynx as per pre-operative assessment  Difficulty Due To: Difficult Airway- due to anterior larynx Future Recommendations: Recommend- induction with short-acting agent, and alternative techniques readily available

## 2016-10-05 NOTE — Transfer of Care (Signed)
Immediate Anesthesia Transfer of Care Note  Patient: Beth QuaySylvia B Nair  Procedure(s) Performed: Procedure(s): SHOULDER ARTHROSCOPY WITH DEBRIDEMENT WITH ROTATOR CUFF REPAIR AND REMOVAL OF RETAINED FOREIGN BODY. (Right)  Patient Location: PACU  Anesthesia Type:General  Level of Consciousness: awake, alert , oriented and patient cooperative  Airway & Oxygen Therapy: Patient Spontanous Breathing and Patient connected to nasal cannula oxygen  Post-op Assessment: Report given to RN and Post -op Vital signs reviewed and stable  Post vital signs: Reviewed and stable  Last Vitals:  Vitals:   10/05/16 1235 10/05/16 1240  BP: 124/71 (!) 143/71  Pulse: 70   Resp: 20   Temp:      Last Pain:  Vitals:   10/05/16 1240  TempSrc:   PainSc: 0-No pain         Complications: No apparent anesthesia complications

## 2016-10-05 NOTE — Anesthesia Procedure Notes (Signed)
Anesthesia Regional Block: Interscalene brachial plexus block   Pre-Anesthetic Checklist: ,, timeout performed, Correct Patient, Correct Site, Correct Laterality, Correct Procedure, Correct Position, site marked, Risks and benefits discussed,  Surgical consent,  Pre-op evaluation,  At surgeon's request and post-op pain management  Laterality: Upper and Right  Prep: chloraprep       Needles:  Injection technique: Single-shot  Needle Type: Stimiplex     Needle Length: 5cm  Needle Gauge: 22     Additional Needles:   Procedures: ultrasound guided,,,,,,,,  Narrative:  Start time: 10/05/2016 12:17 PM End time: 10/05/2016 12:19 PM Injection made incrementally with aspirations every 5 mL.  Performed by: Personally  Anesthesiologist: Margorie JohnPISCITELLO, Soma Bachand K  Additional Notes: Patient endorses baseline weakness in right arm and shoulder  Functioning IV was confirmed and monitors were applied.  A 50mm 22ga Stimuplex needle was used. Sterile prep,hand hygiene and sterile gloves were used.  Minimal sedation used for procedure.  No paresthesia endorsed by patient during the procedure.  Negative aspiration and negative test dose prior to incremental administration of local anesthetic. The patient tolerated the procedure well with no immediate complications.

## 2016-10-05 NOTE — Discharge Instructions (Addendum)
Keep dressing dry and intact.  May shower after dressing changed on post-op day #4 (Saturday).  Cover staples with Band-Aids after drying off. Apply ice frequently to shoulder. Take naproxen 500 mg BID with meals for 7-10 days, then as necessary. Take pain medication as prescribed when needed.  May supplement with ES Tylenol if necessary. Keep shoulder immobilizer on at all times except may remove for bathing purposes. Follow-up in 10-14 days or as scheduled.   AMBULATORY SURGERY  DISCHARGE INSTRUCTIONS   1) The drugs that you were given will stay in your system until tomorrow so for the next 24 hours you should not:  A) Drive an automobile B) Make any legal decisions C) Drink any alcoholic beverage   2) You may resume regular meals tomorrow.  Today it is better to start with liquids and gradually work up to solid foods.  You may eat anything you prefer, but it is better to start with liquids, then soup and crackers, and gradually work up to solid foods.   3) Please notify your doctor immediately if you have any unusual bleeding, trouble breathing, redness and pain at the surgery site, drainage, fever, or pain not relieved by medication.   4) Additional Instructions:

## 2016-10-05 NOTE — Anesthesia Postprocedure Evaluation (Signed)
Anesthesia Post Note  Patient: Beth Beck  Procedure(s) Performed: Procedure(s) (LRB): SHOULDER ARTHROSCOPY WITH DEBRIDEMENT WITH ROTATOR CUFF REPAIR AND REMOVAL OF RETAINED FOREIGN BODY. (Right)  Patient location during evaluation: PACU Anesthesia Type: General Level of consciousness: awake and alert Pain management: pain level controlled Vital Signs Assessment: post-procedure vital signs reviewed and stable Respiratory status: spontaneous breathing, nonlabored ventilation, respiratory function stable and patient connected to nasal cannula oxygen Cardiovascular status: blood pressure returned to baseline and stable Postop Assessment: no signs of nausea or vomiting Anesthetic complications: no     Last Vitals:  Vitals:   10/05/16 1545 10/05/16 1558  BP: (!) 143/68 (!) 161/61  Pulse: (!) 58 60  Resp: 14 16  Temp: 36.6 C 36.1 C    Last Pain:  Vitals:   10/05/16 1558  TempSrc: Temporal  PainSc: 0-No pain                 Cleda MccreedyJoseph K Piscitello

## 2016-10-05 NOTE — Pre-Procedure Instructions (Signed)
The skin reaction is result of vancomycin infusion.  Benadryl given.

## 2016-10-05 NOTE — OR Nursing (Signed)
Pt. To pacu for block. Report given.

## 2016-10-06 ENCOUNTER — Encounter: Payer: Self-pay | Admitting: Surgery

## 2016-11-04 ENCOUNTER — Other Ambulatory Visit: Payer: Self-pay | Admitting: Internal Medicine

## 2016-11-04 DIAGNOSIS — R1084 Generalized abdominal pain: Secondary | ICD-10-CM

## 2016-11-17 ENCOUNTER — Ambulatory Visit
Admission: RE | Admit: 2016-11-17 | Discharge: 2016-11-17 | Disposition: A | Discharge status: Home / Self Care | Payer: Medicare Other | Source: Ambulatory Visit | Attending: Internal Medicine | Admitting: Internal Medicine

## 2016-11-17 DIAGNOSIS — J9811 Atelectasis: Secondary | ICD-10-CM | POA: Diagnosis not present

## 2016-11-17 DIAGNOSIS — R1084 Generalized abdominal pain: Secondary | ICD-10-CM

## 2016-11-17 MED ORDER — IOPAMIDOL (ISOVUE-300) INJECTION 61%
100.0000 mL | Freq: Once | INTRAVENOUS | Status: AC | PRN
  Administered 2016-11-17: 100 mL via INTRAVENOUS

## 2016-11-24 ENCOUNTER — Other Ambulatory Visit
Admission: RE | Admit: 2016-11-24 | Discharge: 2016-11-24 | Disposition: A | Discharge status: Home / Self Care | Payer: Medicare Other | Source: Ambulatory Visit | Attending: Gastroenterology | Admitting: Gastroenterology

## 2016-11-24 DIAGNOSIS — R197 Diarrhea, unspecified: Secondary | ICD-10-CM | POA: Insufficient documentation

## 2016-11-24 LAB — GASTROINTESTINAL PANEL BY PCR, STOOL (REPLACES STOOL CULTURE)
ASTROVIRUS: NOT DETECTED
Adenovirus F40/41: NOT DETECTED
CAMPYLOBACTER SPECIES: NOT DETECTED
Cryptosporidium: NOT DETECTED
Cyclospora cayetanensis: NOT DETECTED
ENTEROTOXIGENIC E COLI (ETEC): NOT DETECTED
Entamoeba histolytica: NOT DETECTED
Enteroaggregative E coli (EAEC): NOT DETECTED
Enteropathogenic E coli (EPEC): NOT DETECTED
Giardia lamblia: NOT DETECTED
NOROVIRUS GI/GII: NOT DETECTED
Plesimonas shigelloides: NOT DETECTED
ROTAVIRUS A: NOT DETECTED
SAPOVIRUS (I, II, IV, AND V): NOT DETECTED
Salmonella species: NOT DETECTED
Shiga like toxin producing E coli (STEC): NOT DETECTED
Shigella/Enteroinvasive E coli (EIEC): NOT DETECTED
VIBRIO CHOLERAE: NOT DETECTED
Vibrio species: NOT DETECTED
Yersinia enterocolitica: NOT DETECTED

## 2016-11-24 LAB — C DIFFICILE QUICK SCREEN W PCR REFLEX
C DIFFICILE (CDIFF) TOXIN: NEGATIVE
C DIFFICLE (CDIFF) ANTIGEN: POSITIVE — AB

## 2016-11-24 LAB — CLOSTRIDIUM DIFFICILE BY PCR: CDIFFPCR: NEGATIVE

## 2016-12-15 ENCOUNTER — Other Ambulatory Visit
Admission: RE | Admit: 2016-12-15 | Discharge: 2016-12-15 | Disposition: A | Discharge status: Home / Self Care | Payer: Medicare Other | Source: Ambulatory Visit | Attending: Gastroenterology | Admitting: Gastroenterology

## 2016-12-15 DIAGNOSIS — R197 Diarrhea, unspecified: Secondary | ICD-10-CM | POA: Insufficient documentation

## 2016-12-15 DIAGNOSIS — B9689 Other specified bacterial agents as the cause of diseases classified elsewhere: Secondary | ICD-10-CM | POA: Insufficient documentation

## 2016-12-15 LAB — C DIFFICILE QUICK SCREEN W PCR REFLEX
C DIFFICILE (CDIFF) INTERP: NOT DETECTED
C DIFFICILE (CDIFF) TOXIN: NEGATIVE
C DIFFICLE (CDIFF) ANTIGEN: NEGATIVE

## 2016-12-20 ENCOUNTER — Other Ambulatory Visit: Payer: Self-pay | Admitting: Internal Medicine

## 2016-12-20 DIAGNOSIS — Z1231 Encounter for screening mammogram for malignant neoplasm of breast: Secondary | ICD-10-CM

## 2016-12-22 ENCOUNTER — Ambulatory Visit
Admission: RE | Admit: 2016-12-22 | Discharge: 2016-12-22 | Disposition: A | Discharge status: Home / Self Care | Payer: Medicare Other | Source: Ambulatory Visit | Attending: Internal Medicine | Admitting: Internal Medicine

## 2016-12-22 DIAGNOSIS — Z1231 Encounter for screening mammogram for malignant neoplasm of breast: Secondary | ICD-10-CM | POA: Insufficient documentation

## 2017-03-14 ENCOUNTER — Encounter: Payer: Self-pay | Admitting: *Deleted

## 2017-03-15 ENCOUNTER — Ambulatory Visit
Admission: RE | Admit: 2017-03-15 | Discharge: 2017-03-15 | Disposition: A | Discharge status: Home / Self Care | Payer: Medicare Other | Source: Ambulatory Visit | Attending: Gastroenterology | Admitting: Gastroenterology

## 2017-03-15 ENCOUNTER — Encounter
Admission: RE | Disposition: A | Discharge status: Home / Self Care | Payer: Self-pay | Source: Ambulatory Visit | Attending: Gastroenterology

## 2017-03-15 ENCOUNTER — Ambulatory Visit: Payer: Medicare Other | Admitting: Anesthesiology

## 2017-03-15 ENCOUNTER — Encounter: Payer: Self-pay | Admitting: Anesthesiology

## 2017-03-15 DIAGNOSIS — Q438 Other specified congenital malformations of intestine: Secondary | ICD-10-CM | POA: Insufficient documentation

## 2017-03-15 DIAGNOSIS — Z79899 Other long term (current) drug therapy: Secondary | ICD-10-CM | POA: Diagnosis not present

## 2017-03-15 DIAGNOSIS — K5730 Diverticulosis of large intestine without perforation or abscess without bleeding: Secondary | ICD-10-CM | POA: Insufficient documentation

## 2017-03-15 DIAGNOSIS — K529 Noninfective gastroenteritis and colitis, unspecified: Secondary | ICD-10-CM | POA: Diagnosis not present

## 2017-03-15 DIAGNOSIS — Z791 Long term (current) use of non-steroidal anti-inflammatories (NSAID): Secondary | ICD-10-CM | POA: Insufficient documentation

## 2017-03-15 DIAGNOSIS — K219 Gastro-esophageal reflux disease without esophagitis: Secondary | ICD-10-CM | POA: Insufficient documentation

## 2017-03-15 DIAGNOSIS — I129 Hypertensive chronic kidney disease with stage 1 through stage 4 chronic kidney disease, or unspecified chronic kidney disease: Secondary | ICD-10-CM | POA: Insufficient documentation

## 2017-03-15 DIAGNOSIS — N189 Chronic kidney disease, unspecified: Secondary | ICD-10-CM | POA: Insufficient documentation

## 2017-03-15 DIAGNOSIS — Z7982 Long term (current) use of aspirin: Secondary | ICD-10-CM | POA: Insufficient documentation

## 2017-03-15 DIAGNOSIS — G4730 Sleep apnea, unspecified: Secondary | ICD-10-CM | POA: Insufficient documentation

## 2017-03-15 DIAGNOSIS — R194 Change in bowel habit: Secondary | ICD-10-CM | POA: Diagnosis present

## 2017-03-15 DIAGNOSIS — Z79890 Hormone replacement therapy (postmenopausal): Secondary | ICD-10-CM | POA: Diagnosis not present

## 2017-03-15 HISTORY — PX: COLONOSCOPY WITH PROPOFOL: SHX5780

## 2017-03-15 HISTORY — DX: Essential (primary) hypertension: I10

## 2017-03-15 HISTORY — DX: Other cervical disc degeneration, unspecified cervical region: M50.30

## 2017-03-15 HISTORY — DX: Chronic sinusitis, unspecified: J32.9

## 2017-03-15 HISTORY — DX: Bradycardia, unspecified: R00.1

## 2017-03-15 HISTORY — DX: Dyspnea, unspecified: R06.00

## 2017-03-15 SURGERY — COLONOSCOPY WITH PROPOFOL
Anesthesia: General

## 2017-03-15 MED ORDER — SCOPOLAMINE 1 MG/3DAYS TD PT72
1.0000 | MEDICATED_PATCH | TRANSDERMAL | Status: DC
  Administered 2017-03-15: 1.5 mg via TRANSDERMAL

## 2017-03-15 MED ORDER — SODIUM CHLORIDE 0.9 % IV SOLN
INTRAVENOUS | Status: DC
  Administered 2017-03-15: 1000 mL via INTRAVENOUS

## 2017-03-15 MED ORDER — MIDAZOLAM HCL 2 MG/2ML IJ SOLN
INTRAMUSCULAR | Status: AC
  Filled 2017-03-15: qty 2

## 2017-03-15 MED ORDER — LIDOCAINE HCL (PF) 1 % IJ SOLN
INTRAMUSCULAR | Status: AC
  Administered 2017-03-15: 0.3 mL via INTRADERMAL
  Filled 2017-03-15: qty 2

## 2017-03-15 MED ORDER — PROPOFOL 500 MG/50ML IV EMUL
INTRAVENOUS | Status: DC | PRN
  Administered 2017-03-15: 200 ug/kg/min via INTRAVENOUS

## 2017-03-15 MED ORDER — FENTANYL CITRATE (PF) 100 MCG/2ML IJ SOLN
INTRAMUSCULAR | Status: DC | PRN
  Administered 2017-03-15: 25 ug via INTRAVENOUS

## 2017-03-15 MED ORDER — SODIUM CHLORIDE 0.9 % IV SOLN
INTRAVENOUS | Status: DC
  Administered 2017-03-15: 11:00:00 via INTRAVENOUS

## 2017-03-15 MED ORDER — MIDAZOLAM HCL 2 MG/2ML IJ SOLN
INTRAMUSCULAR | Status: DC | PRN
  Administered 2017-03-15: 2 mg via INTRAVENOUS

## 2017-03-15 MED ORDER — LIDOCAINE HCL (PF) 1 % IJ SOLN
2.0000 mL | Freq: Once | INTRAMUSCULAR | Status: AC
  Administered 2017-03-15: 0.3 mL via INTRADERMAL

## 2017-03-15 MED ORDER — SCOPOLAMINE 1 MG/3DAYS TD PT72
MEDICATED_PATCH | TRANSDERMAL | Status: AC
  Filled 2017-03-15: qty 1

## 2017-03-15 MED ORDER — FENTANYL CITRATE (PF) 100 MCG/2ML IJ SOLN
INTRAMUSCULAR | Status: AC
  Filled 2017-03-15: qty 2

## 2017-03-15 NOTE — Anesthesia Post-op Follow-up Note (Signed)
Anesthesia QCDR form completed.        

## 2017-03-15 NOTE — Anesthesia Preprocedure Evaluation (Addendum)
Anesthesia Evaluation  Patient identified by MRN, date of birth, ID band Patient awake    Reviewed: Allergy & Precautions, NPO status , Patient's Chart, lab work & pertinent test results, reviewed documented beta blocker date and time   History of Anesthesia Complications (+) PONV, DIFFICULT AIRWAY and history of anesthetic complications  Airway Mallampati: III  TM Distance: >3 FB     Dental  (+) Chipped   Pulmonary shortness of breath, sleep apnea ,           Cardiovascular hypertension, Pt. on medications and Pt. on home beta blockers      Neuro/Psych  Headaches, Anxiety  Neuromuscular disease    GI/Hepatic   Endo/Other    Renal/GU Renal disease     Musculoskeletal  (+) Arthritis ,   Abdominal   Peds  Hematology  (+) anemia ,   Anesthesia Other Findings   Reproductive/Obstetrics                            Anesthesia Physical Anesthesia Plan  ASA: III  Anesthesia Plan: General   Post-op Pain Management:    Induction: Intravenous  PONV Risk Score and Plan:   Airway Management Planned:   Additional Equipment:   Intra-op Plan:   Post-operative Plan:   Informed Consent: I have reviewed the patients History and Physical, chart, labs and discussed the procedure including the risks, benefits and alternatives for the proposed anesthesia with the patient or authorized representative who has indicated his/her understanding and acceptance.     Plan Discussed with: CRNA  Anesthesia Plan Comments:         Anesthesia Quick Evaluation

## 2017-03-15 NOTE — Anesthesia Postprocedure Evaluation (Signed)
Anesthesia Post Note  Patient: Beth Beck  Procedure(s) Performed: COLONOSCOPY WITH PROPOFOL (N/A )  Patient location during evaluation: Endoscopy Anesthesia Type: General Level of consciousness: awake and alert Pain management: pain level controlled Vital Signs Assessment: post-procedure vital signs reviewed and stable Respiratory status: spontaneous breathing, nonlabored ventilation, respiratory function stable and patient connected to nasal cannula oxygen Cardiovascular status: blood pressure returned to baseline and stable Postop Assessment: no apparent nausea or vomiting Anesthetic complications: no     Last Vitals:  Vitals:   03/15/17 1219 03/15/17 1229  BP: 140/74 (!) 148/78  Pulse: 75 74  Resp: 15 17  Temp:    SpO2: 100% 99%    Last Pain:  Vitals:   03/15/17 1209  TempSrc: Tympanic  PainSc:                  [name]

## 2017-03-15 NOTE — H&P (Signed)
Outpatient short stay form Pre-procedure 03/15/2017 9:53 AM Beth Sails MD  Primary Physician: Dr. Fulton Reek  Reason for visit:  Colonoscopy  History of present illness:  Patient is a 71 year old female presenting today as above. She has a history of constipation number years ago however over the period of the past year or so has been on multiple courses of antibiotics D date contracts C. difficile which was treated and verified as negative. Her she has continued with loose stools since that time several months ago. Her last colonoscopy was 01/05/2008 with a finding of diverticulosis. I tried to find path reports for that date and have not been able to do that. She tolerated the prep well. She has held her 81 mg aspirin that she takes daily for about a week however has continued to take a daily Excedrin. We discussed that this still contains aspirin.    Current Facility-Administered Medications:  .  lidocaine (PF) (XYLOCAINE) 1 % injection, , , ,  .  scopolamine (TRANSDERM-SCOP) 1 MG/3DAYS, , , ,  .  0.9 %  sodium chloride infusion, , Intravenous, Continuous, Melanee Cordial U, MD .  0.9 %  sodium chloride infusion, , Intravenous, Continuous, Vona Whiters U, MD .  lidocaine (PF) (XYLOCAINE) 1 % injection 2 mL, 2 mL, Intradermal, Once, Beth Sails, MD .  scopolamine (TRANSDERM-SCOP) 1 MG/3DAYS 1.5 mg, 1 patch, Transdermal, Q72H, Gunnar Bulla, MD  Medications Prior to Admission  Medication Sig Dispense Refill Last Dose  . aspirin (BAYER ASPIRIN EC LOW DOSE) 81 MG EC tablet Take 81 mg by mouth at bedtime.    03/10/2017  . aspirin-acetaminophen-caffeine (EXCEDRIN MIGRAINE) 250-250-65 MG tablet Take 2 tablets by mouth daily as needed for headache.   03/13/2017  . Azelastine-Fluticasone (DYMISTA) 137-50 MCG/ACT SUSP Place 1 spray into the nose daily. (Patient not taking: Reported on 09/22/2016) 23 g 0 Not Taking at Unknown time  . butalbital-acetaminophen-caffeine (FIORICET)  50-325-40 MG tablet Take 1 tablet by mouth every evening.    10/04/2016 at Unknown time  . CALCIUM-MAGNESIUM-ZINC PO Take 1-2 capsules by mouth 2 (two) times daily. 2 in the morning and 1 at night   10/04/2016  . Cyanocobalamin (VITAMIN B-12 PO) Take 1 tablet by mouth daily.   10/04/2016 at Unknown time  . esomeprazole (NEXIUM) 40 MG capsule Take 40 mg by mouth every morning.    10/05/2016 at Unknown time  . estrogens, conjugated, (PREMARIN) 0.625 MG tablet Take 0.625 mg by mouth every evening.    10/04/2016 at Unknown time  . fluticasone (FLONASE) 50 MCG/ACT nasal spray Place 2 sprays into both nostrils daily as needed for allergies or rhinitis.    Past Week at Unknown time  . gabapentin (NEURONTIN) 300 MG capsule Take 600 mg by mouth 2 (two) times daily.   10/05/2016 at Unknown time  . Hypertonic Nasal Wash (SINUS RINSE BOTTLE KIT NA) Place into the nose 2 (two) times daily.   10/05/2016 at Unknown time  . linezolid (ZYVOX) 600 MG tablet Take 600 mg by mouth 2 (two) times daily. Starting 09/22/16-10/05/16   Not Taking at Unknown time  . lovastatin (MEVACOR) 40 MG tablet Take 40 mg by mouth at bedtime.   10/04/2016 at Unknown time  . Multiple Vitamins-Minerals (CENTRUM SILVER PO) Take 1 tablet by mouth daily.    10/04/2016 at Unknown time  . nadolol (CORGARD) 40 MG tablet Take 40 mg by mouth every evening.    10/04/2016 at Unknown time  . naproxen (NAPROSYN)  500 MG tablet Take 500 mg by mouth every evening.    09/28/2016  . oxyCODONE (ROXICODONE) 5 MG immediate release tablet Take 1-2 tablets (5-10 mg total) by mouth every 4 (four) hours as needed for severe pain. 40 tablet 0   . Probiotic Product (PROBIOTIC DAILY) CAPS Take 1 capsule by mouth daily.   10/04/2016 at Unknown time     Allergies  Allergen Reactions  . Dymista [Azelastine-Fluticasone] Shortness Of Breath, Palpitations and Other (See Comments)    Dry mouth  . Amoxicillin Itching    Felt like she had a yeast infection. Has patient had a PCN reaction  causing immediate rash, facial/tongue/throat swelling, SOB or lightheadedness with hypotension: No Has patient had a PCN reaction causing severe rash involving mucus membranes or skin necrosis: No Has patient had a PCN reaction that required hospitalization: No Has patient had a PCN reaction occurring within the last 10 years: Yes If all of the above answers are "NO", then may proceed with Cephalosporin use.  . Bactrim [Sulfamethoxazole-Trimethoprim] Itching  . Clindamycin/Lincomycin Nausea And Vomiting  . Cymbalta [Duloxetine Hcl] Other (See Comments)    "bad dreams"  . Escitalopram Nausea Only and Other (See Comments)    "bad dreams"  . Meloxicam Itching and Nausea And Vomiting  . Sulfa Antibiotics Hives and Itching  . Tetracyclines & Related Hives and Diarrhea     Past Medical History:  Diagnosis Date  . Anemia   . Anxiety   . Bradycardia   . Chronic kidney disease    History Of UTI  . DDD (degenerative disc disease), cervical   . Difficult intubation   . Dyspnea   . GERD (gastroesophageal reflux disease)   . Headache    migraines - none recently, on medicine regimen  . Hypercholesteremia   . Hypertension   . Irritable bowel syndrome (IBS)   . Neuromuscular disorder ([place])    Right arm weakness s/p RCR surg x2  . PONV (postoperative nausea and vomiting)   . Post herpetic neuralgia    right rib cage. shingles approx 7 yrs ago  . Shingles    approx 7 yrs ago - right ribs  . Sinus congestion   . Sinusitis   . Sleep apnea    fixed with UPPP surgery    Review of systems:      Physical Exam    Heart and lungs: Regular rate and rhythm without rub or gallop, lungs are bilaterally clear.    HEENT: Normocephalic atraumatic eyes are anicteric    Other:     Pertinant exam for procedure: Soft nontender nondistended bowel sounds positive normoactive    Planned proceedures: Colonoscopy and indicated procedures. I have discussed the risks benefits and complications of  procedures to include not limited to bleeding, infection, perforation and the risk of sedation and the patient wishes to proceed.    Beth Sails, MD Gastroenterology 03/15/2017  9:53 AM

## 2017-03-15 NOTE — Anesthesia Procedure Notes (Signed)
Date/Time: 03/15/2017 11:12 AM Performed by: Henrietta HooverPope, Mirca Yale, CRNA Pre-anesthesia Checklist: Patient identified, Emergency Drugs available, Patient being monitored, Suction available and Timeout performed Patient Re-evaluated:Patient Re-evaluated prior to induction Oxygen Delivery Method: Nasal cannula Placement Confirmation: positive ETCO2 Dental Injury: Teeth and Oropharynx as per pre-operative assessment

## 2017-03-15 NOTE — Op Note (Signed)
[facility] Gastroenterology Patient Name: Beth DouglasSylvia Beck Procedure Date: 03/15/2017 10:47 AM MRN: [id number] Account #: [id number] Date of Birth: [birth year] Admit Type: Outpatient Age: [age] [facility] Gender: Female Note Status: Finalized Procedure:            Colonoscopy Indications:          Change in bowel habits, Chronic diarrhea Providers:            Christena DeemMartin U. Iona Stay, MD Referring MD:         Duane LopeJeffrey D. Judithann SheenSparks, MD (Referring MD) Medicines:            Monitored Anesthesia Care Complications:        No immediate complications. Procedure:            Pre-Anesthesia Assessment:                       - ASA Grade Assessment: III - A patient with severe                        systemic disease.                       After obtaining informed consent, the colonoscope was                        passed under direct vision. Throughout the procedure,                        the patient's blood pressure, pulse, and oxygen                        saturations were monitored continuously. The                        Colonoscope was introduced through the anus and                        advanced to the. The Colonoscope was introduced through                        the anus and advanced to the the cecum, identified by                        appendiceal orifice and ileocecal valve. The                        colonoscopy was unusually difficult due to restricted                        mobility of the colon, significant looping and a                        tortuous colon. Successful completion of the procedure                        was aided by changing the patient to a supine position,                        changing the patient to a prone position, using manual  pressure and changing endoscopes. The patient tolerated                        the procedure well. The quality of the bowel                        preparation was good. Findings:      The  sigmoid colon, descending colon and transverse colon were       significantly redundant.      Biopsies for histology were taken with a cold forceps from the right       colon and left colon for evaluation of microscopic colitis.      A few small-mouthed diverticula were found in the sigmoid colon and       distal descending colon.      The retroflexed view of the distal rectum and anal verge was normal and       showed no anal or rectal abnormalities.      The digital rectal exam was normal. Impression:           - Redundant colon.                       - Diverticulosis in the sigmoid colon and in the distal                        descending colon.                       - The distal rectum and anal verge are normal on                        retroflexion view.                       - Biopsies were taken with a cold forceps from the                        right colon and left colon for evaluation of                        microscopic colitis. Recommendation:       - Discharge patient to home.                       - Await pathology results.                       - Return to GI clinic in 4 weeks. Procedure Code(s):    --- Professional ---                       [id number], Colonoscopy, flexible; with biopsy, single or                        multiple Diagnosis Code(s):    --- Professional ---                       R19.4, Change in bowel habit                       K52.9, Noninfective gastroenteritis and colitis,  unspecified                       K57.30, Diverticulosis of large intestine without                        perforation or abscess without bleeding                       Q43.8, Other specified congenital malformations of                        intestine CPT copyright 2016 [employer]. All rights reserved. The codes documented in this report are preliminary and upon coder review may  be revised to meet current compliance requirements. Christena Deem, MD 03/15/2017 12:07:30 PM This report has been signed electronically. Number of Addenda: 0 Note Initiated On: 03/15/2017 10:47 AM Scope Withdrawal Time: 0 hours 10 minutes 46 seconds  Total Procedure Duration: 0 hours 50 minutes 46 seconds       [facility]

## 2017-03-15 NOTE — Transfer of Care (Signed)
Immediate Anesthesia Transfer of Care Note  Patient: Beth Beck  Procedure(s) Performed: COLONOSCOPY WITH PROPOFOL (N/A )  Patient Location: PACU  Anesthesia Type:General  Level of Consciousness: awake  Airway & Oxygen Therapy: Patient Spontanous Breathing and Patient connected to nasal cannula oxygen  Post-op Assessment: Report given to RN and Post -op Vital signs reviewed and stable  Post vital signs: Reviewed and stable  Last Vitals:  Vitals:   03/15/17 0931 03/15/17 1209  BP: (!) 156/99 130/68  Pulse: 96 80  Resp: 16 (!) 21  Temp: 36.5 C (!) 36.2 C  SpO2: 99% 100%    Last Pain:  Vitals:   03/15/17 1209  TempSrc: Tympanic  PainSc:          Complications: No apparent anesthesia complications

## 2017-03-16 ENCOUNTER — Encounter: Payer: Self-pay | Admitting: Gastroenterology

## 2017-03-16 LAB — SURGICAL PATHOLOGY

## 2017-04-28 ENCOUNTER — Telehealth: Payer: Self-pay | Admitting: Internal Medicine

## 2017-04-28 NOTE — Telephone Encounter (Signed)
Attempted to schedule recall  Patient states they are not interested in follow up Will call if any issues arise Deleting recall

## 2017-06-02 ENCOUNTER — Other Ambulatory Visit: Payer: Self-pay | Admitting: Internal Medicine

## 2017-06-02 DIAGNOSIS — R0789 Other chest pain: Secondary | ICD-10-CM

## 2017-06-20 ENCOUNTER — Ambulatory Visit
Admission: RE | Admit: 2017-06-20 | Discharge: 2017-06-20 | Disposition: A | Discharge status: Home / Self Care | Payer: Medicare Other | Source: Ambulatory Visit | Attending: Internal Medicine | Admitting: Internal Medicine

## 2017-06-20 DIAGNOSIS — R918 Other nonspecific abnormal finding of lung field: Secondary | ICD-10-CM | POA: Diagnosis not present

## 2017-06-20 DIAGNOSIS — R0789 Other chest pain: Secondary | ICD-10-CM | POA: Diagnosis not present

## 2017-06-20 DIAGNOSIS — J479 Bronchiectasis, uncomplicated: Secondary | ICD-10-CM | POA: Diagnosis not present

## 2017-06-20 DIAGNOSIS — I700 Atherosclerosis of aorta: Secondary | ICD-10-CM | POA: Diagnosis not present

## 2017-11-23 ENCOUNTER — Other Ambulatory Visit: Payer: Self-pay | Admitting: Internal Medicine

## 2017-11-23 DIAGNOSIS — Z1231 Encounter for screening mammogram for malignant neoplasm of breast: Secondary | ICD-10-CM

## 2017-12-23 ENCOUNTER — Ambulatory Visit
Admission: RE | Admit: 2017-12-23 | Discharge: 2017-12-23 | Disposition: A | Discharge status: Home / Self Care | Payer: Medicare Other | Source: Ambulatory Visit | Attending: Internal Medicine | Admitting: Internal Medicine

## 2017-12-23 DIAGNOSIS — Z1231 Encounter for screening mammogram for malignant neoplasm of breast: Secondary | ICD-10-CM | POA: Insufficient documentation

## 2018-11-24 ENCOUNTER — Other Ambulatory Visit: Payer: Self-pay | Admitting: Internal Medicine

## 2018-11-24 DIAGNOSIS — Z1231 Encounter for screening mammogram for malignant neoplasm of breast: Secondary | ICD-10-CM

## 2018-12-27 ENCOUNTER — Ambulatory Visit
Admission: RE | Admit: 2018-12-27 | Discharge: 2018-12-27 | Disposition: A | Discharge status: Home / Self Care | Payer: Medicare Other | Source: Ambulatory Visit | Attending: Internal Medicine | Admitting: Internal Medicine

## 2018-12-27 DIAGNOSIS — Z1231 Encounter for screening mammogram for malignant neoplasm of breast: Secondary | ICD-10-CM | POA: Insufficient documentation

## 2019-02-25 IMAGING — CT CT ABD-PELV W/ CM
1 of 3 series · 14 of 32 positions shown, 19 images · IV contrast (APPLIED)
Comparison: Chest CT dated 04/08/2016

CLINICAL DATA: Generalized abdominal pain. Diarrhea. Constipation.

EXAM:
CT ABDOMEN AND PELVIS WITH CONTRAST
TECHNIQUE: Multidetector CT imaging of the abdomen and pelvis was performed
using the standard protocol following bolus administration of
intravenous contrast.
CONTRAST:  100mL A5Q9Y1-9II IOPAMIDOL (A5Q9Y1-9II) INJECTION 61%

[Series 2: axial st · axial · 0.68mm/px · z∈[-976,-531]mm · 14 of 101 slices shown, 19 images]
[im 6/101  soft-tissue]
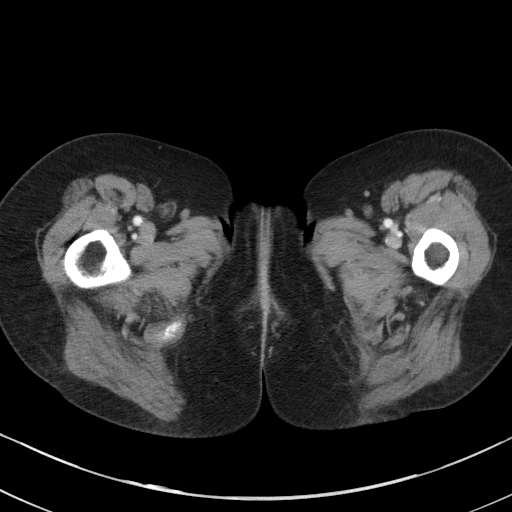
[im 6/101  bone]
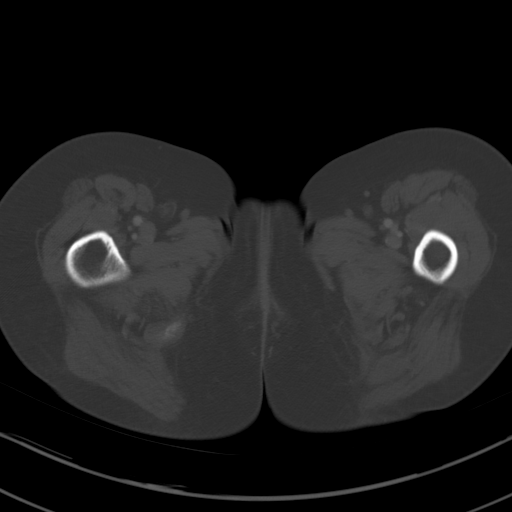
[im 16/101  soft-tissue]
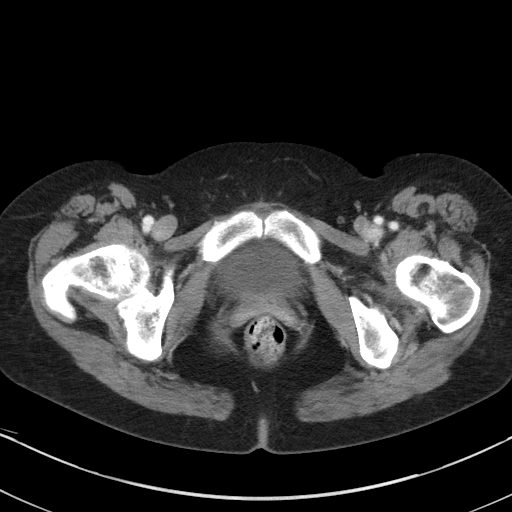
[im 22/101  soft-tissue]
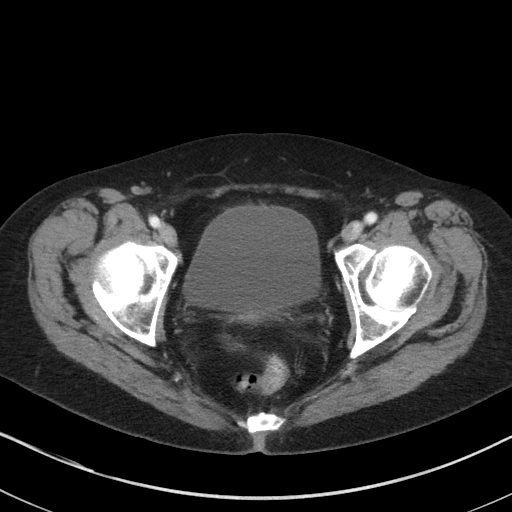
[im 27/101  soft-tissue]
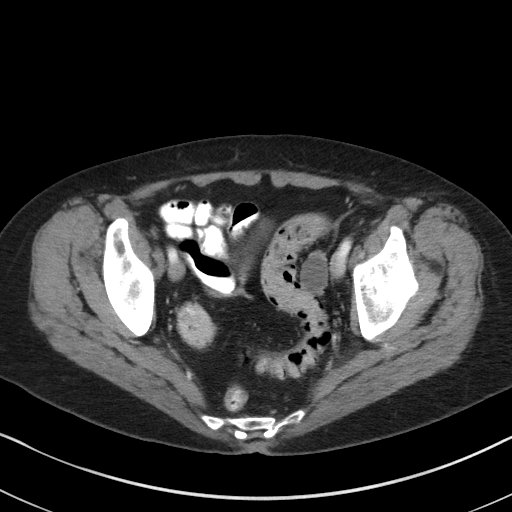
[im 37/101  soft-tissue]
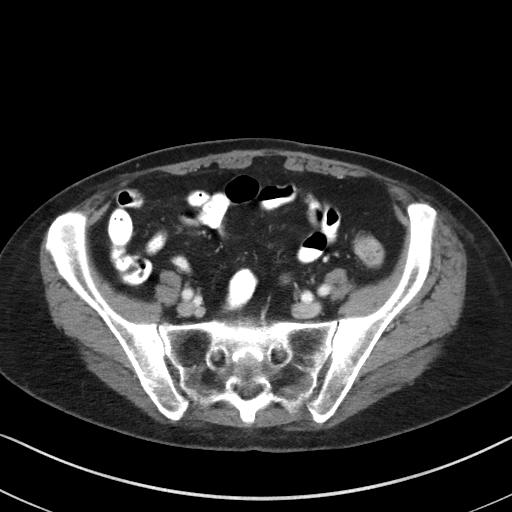
[im 43/101  soft-tissue]
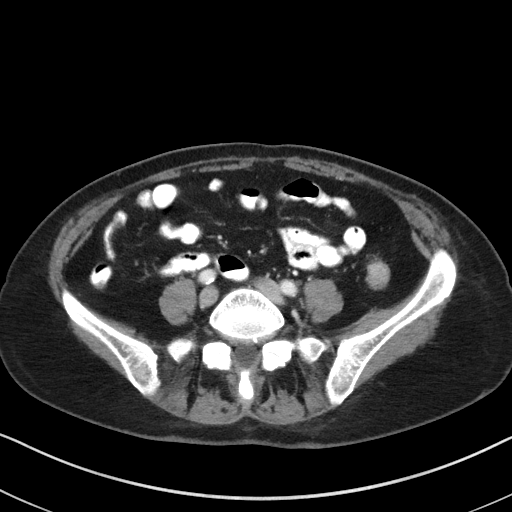
[im 53/101  soft-tissue]
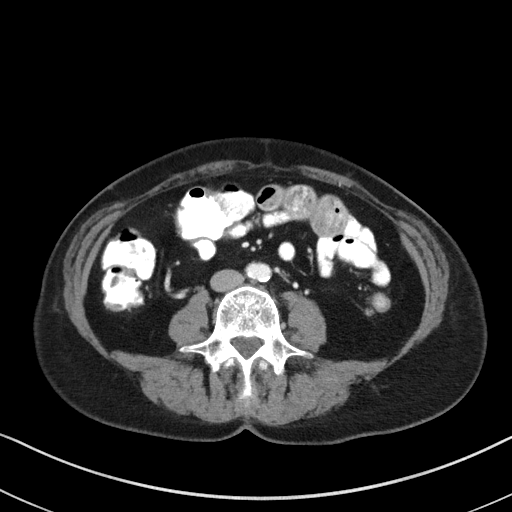
[im 58/101  soft-tissue]
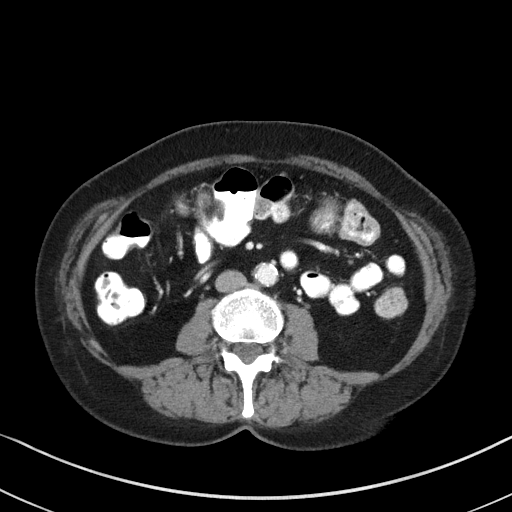
[im 64/101  soft-tissue]
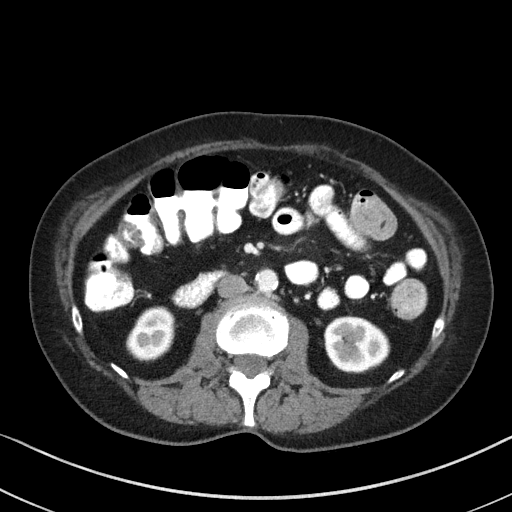
[im 64/101  bone]
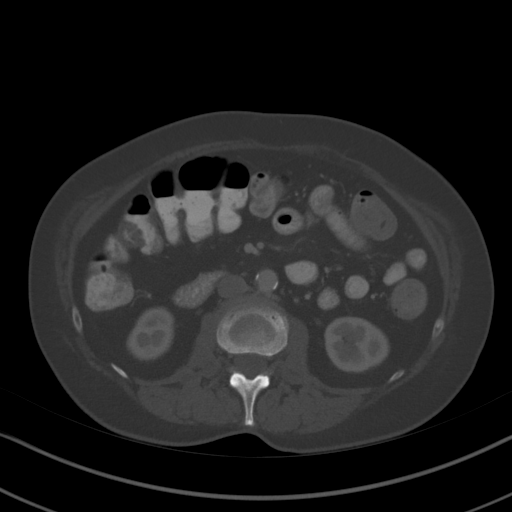
[im 74/101  soft-tissue]
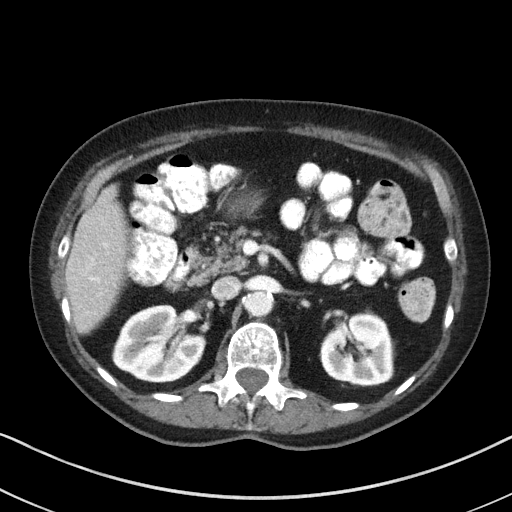
[im 79/101  soft-tissue]
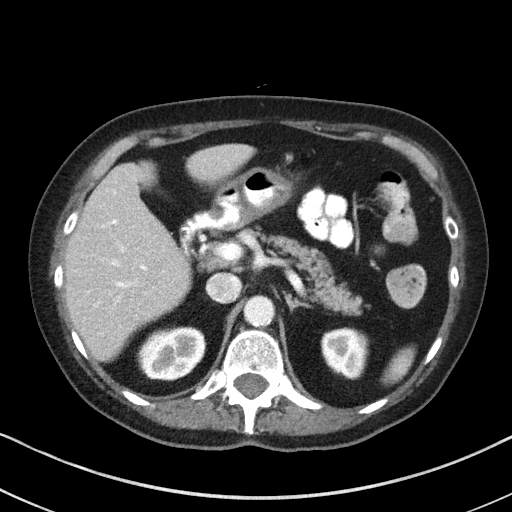
[im 79/101  lung]
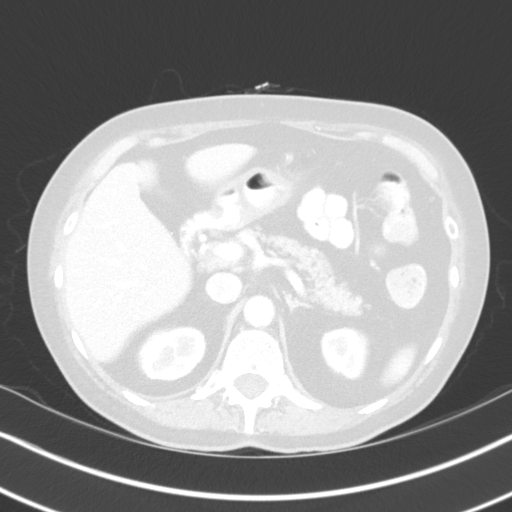
[im 85/101  soft-tissue]
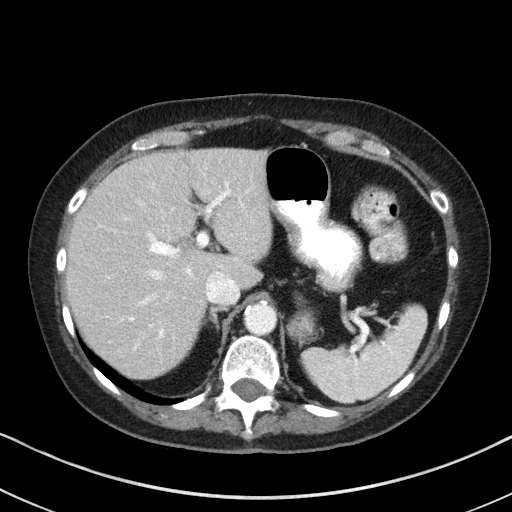
[im 85/101  lung]
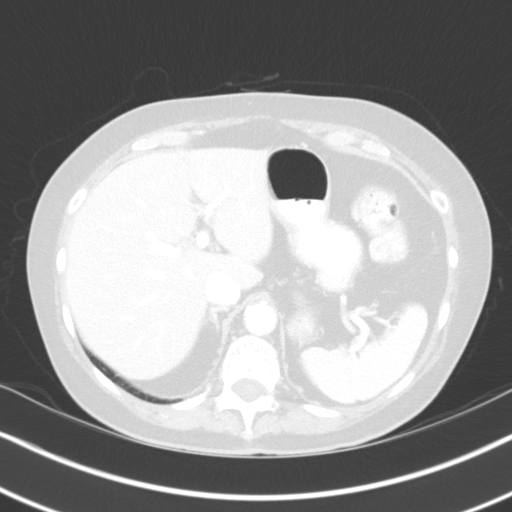
[im 90/101  lung]
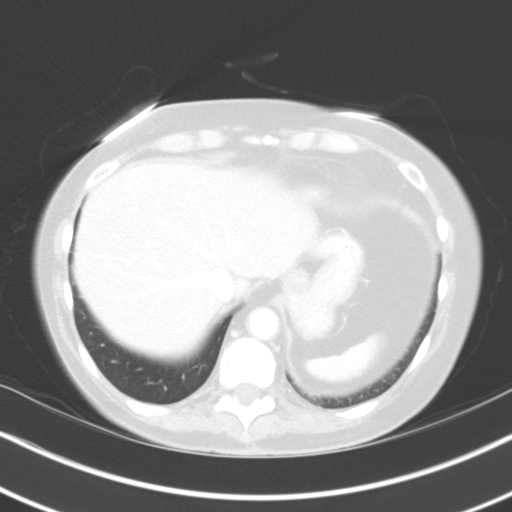
[im 95/101  soft-tissue]
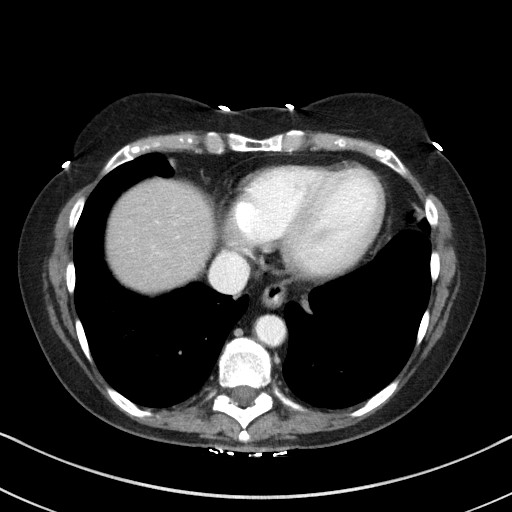
[im 95/101  lung]
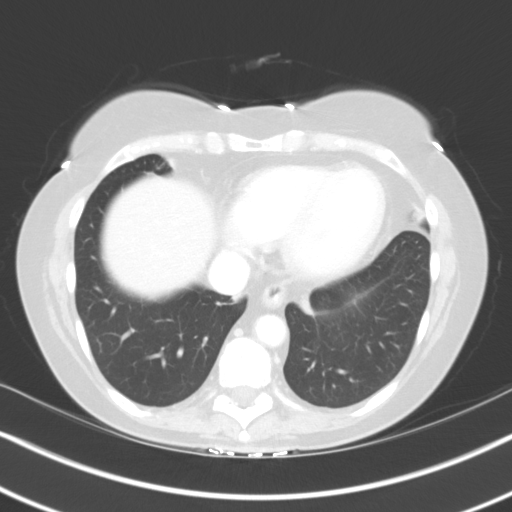

[14 of 32 positions shown; findings below may reference images not displayed]

FINDINGS: Lower chest: There is persistent atelectasis in the medial segment
of the right middle lobe, incompletely visualized on this of valve
CT scan. I recommend follow-up chest CT scan with contrast for
further characterization of this persistent abnormality.

Hepatobiliary: No focal liver abnormality is seen. Status post
cholecystectomy. No biliary dilatation.

Pancreas: Unremarkable. No pancreatic ductal dilatation or
surrounding inflammatory changes.

Spleen: Normal in size without focal abnormality.

Adrenals/Urinary Tract: Adrenal glands are unremarkable. Kidneys are
normal, without renal calculi, focal lesion, or hydronephrosis.
Bladder is unremarkable.

Stomach/Bowel: Stomach is within normal limits. Appendix appears
normal. No evidence of bowel wall thickening, distention, or
inflammatory changes.There is a pin extends to

Vascular/Lymphatic: Aortic atherosclerosis. No enlarged abdominal or
pelvic lymph nodes.

Reproductive: 2.4 cm cyst on the otherwise normal left ovary. Normal
right ovary. Uterus has been removed.

Other: No abdominal wall hernia or abnormality. No abdominopelvic
ascites.

Musculoskeletal: No acute or significant osseous findings.
IMPRESSION: 1. Benign-appearing abdomen and pelvis.
2. Persistent atelectasis in the medial segment of the right middle
lobe, incompletely visualized. The patient had an abnormality in the
right hilum and right middle lobe on prior chest CT 04/08/2016. I
recommend a follow-up chest CT with contrast for further evaluation
unless the this is been imaged elsewhere in the interval. Check no

## 2019-09-26 ENCOUNTER — Other Ambulatory Visit: Payer: Self-pay | Admitting: Internal Medicine

## 2019-09-26 DIAGNOSIS — Z1231 Encounter for screening mammogram for malignant neoplasm of breast: Secondary | ICD-10-CM

## 2020-01-08 ENCOUNTER — Other Ambulatory Visit: Payer: Self-pay | Admitting: Internal Medicine

## 2020-01-08 DIAGNOSIS — R55 Syncope and collapse: Secondary | ICD-10-CM

## 2020-01-23 ENCOUNTER — Ambulatory Visit
Admission: RE | Admit: 2020-01-23 | Discharge: 2020-01-23 | Disposition: A | Discharge status: Home / Self Care | Payer: Medicare Other | Source: Ambulatory Visit | Attending: Internal Medicine | Admitting: Internal Medicine

## 2020-01-23 ENCOUNTER — Other Ambulatory Visit: Payer: Self-pay

## 2020-01-23 DIAGNOSIS — Z1231 Encounter for screening mammogram for malignant neoplasm of breast: Secondary | ICD-10-CM

## 2020-01-25 ENCOUNTER — Other Ambulatory Visit: Payer: Self-pay

## 2020-01-25 ENCOUNTER — Ambulatory Visit
Admission: RE | Admit: 2020-01-25 | Discharge: 2020-01-25 | Disposition: A | Discharge status: Home / Self Care | Payer: Medicare Other | Source: Ambulatory Visit | Attending: Internal Medicine | Admitting: Internal Medicine

## 2020-01-25 DIAGNOSIS — R55 Syncope and collapse: Secondary | ICD-10-CM | POA: Insufficient documentation

## 2020-01-30 ENCOUNTER — Other Ambulatory Visit: Payer: Self-pay | Admitting: Internal Medicine

## 2020-01-30 DIAGNOSIS — R928 Other abnormal and inconclusive findings on diagnostic imaging of breast: Secondary | ICD-10-CM

## 2020-01-30 DIAGNOSIS — R921 Mammographic calcification found on diagnostic imaging of breast: Secondary | ICD-10-CM

## 2020-01-31 ENCOUNTER — Other Ambulatory Visit: Payer: Self-pay

## 2020-01-31 ENCOUNTER — Ambulatory Visit
Admission: RE | Admit: 2020-01-31 | Discharge: 2020-01-31 | Disposition: A | Discharge status: Home / Self Care | Payer: Medicare Other | Source: Ambulatory Visit | Attending: Internal Medicine | Admitting: Internal Medicine

## 2020-01-31 DIAGNOSIS — R921 Mammographic calcification found on diagnostic imaging of breast: Secondary | ICD-10-CM | POA: Diagnosis present

## 2020-01-31 DIAGNOSIS — R928 Other abnormal and inconclusive findings on diagnostic imaging of breast: Secondary | ICD-10-CM | POA: Diagnosis present

## 2020-12-30 ENCOUNTER — Other Ambulatory Visit: Payer: Self-pay | Admitting: Internal Medicine

## 2020-12-30 DIAGNOSIS — Z1231 Encounter for screening mammogram for malignant neoplasm of breast: Secondary | ICD-10-CM

## 2021-01-23 ENCOUNTER — Ambulatory Visit
Admission: RE | Admit: 2021-01-23 | Discharge: 2021-01-23 | Disposition: A | Discharge status: Home / Self Care | Payer: Medicare Other | Source: Ambulatory Visit | Attending: Internal Medicine | Admitting: Internal Medicine

## 2021-01-23 ENCOUNTER — Other Ambulatory Visit: Payer: Self-pay

## 2021-01-23 DIAGNOSIS — Z1231 Encounter for screening mammogram for malignant neoplasm of breast: Secondary | ICD-10-CM | POA: Diagnosis present

## 2021-07-03 ENCOUNTER — Other Ambulatory Visit: Payer: Self-pay | Admitting: Internal Medicine

## 2021-07-03 DIAGNOSIS — M542 Cervicalgia: Secondary | ICD-10-CM

## 2021-07-03 DIAGNOSIS — R490 Dysphonia: Secondary | ICD-10-CM

## 2021-07-07 ENCOUNTER — Ambulatory Visit
Admission: RE | Admit: 2021-07-07 | Discharge: 2021-07-07 | Disposition: A | Discharge status: Home / Self Care | Payer: Medicare HMO | Source: Ambulatory Visit | Attending: Internal Medicine | Admitting: Internal Medicine

## 2021-07-07 ENCOUNTER — Other Ambulatory Visit: Payer: Self-pay

## 2021-07-07 DIAGNOSIS — M542 Cervicalgia: Secondary | ICD-10-CM | POA: Insufficient documentation

## 2021-07-07 DIAGNOSIS — R490 Dysphonia: Secondary | ICD-10-CM | POA: Diagnosis present

## 2021-07-07 MED ORDER — IOHEXOL 300 MG/ML  SOLN
75.0000 mL | Freq: Once | INTRAMUSCULAR | Status: AC | PRN
  Administered 2021-07-07: 75 mL via INTRAVENOUS

## 2021-10-01 ENCOUNTER — Other Ambulatory Visit: Payer: Self-pay | Admitting: Otolaryngology

## 2021-10-01 DIAGNOSIS — H9041 Sensorineural hearing loss, unilateral, right ear, with unrestricted hearing on the contralateral side: Secondary | ICD-10-CM

## 2021-10-13 ENCOUNTER — Ambulatory Visit
Admission: RE | Admit: 2021-10-13 | Discharge: 2021-10-13 | Disposition: A | Discharge status: Home / Self Care | Payer: Medicare HMO | Source: Ambulatory Visit | Attending: Otolaryngology | Admitting: Otolaryngology

## 2021-10-13 DIAGNOSIS — H9041 Sensorineural hearing loss, unilateral, right ear, with unrestricted hearing on the contralateral side: Secondary | ICD-10-CM

## 2021-10-13 MED ORDER — GADOBENATE DIMEGLUMINE 529 MG/ML IV SOLN
13.0000 mL | Freq: Once | INTRAVENOUS | Status: AC | PRN
  Administered 2021-10-13: 13 mL via INTRAVENOUS

## 2021-12-16 ENCOUNTER — Other Ambulatory Visit: Payer: Self-pay | Admitting: Internal Medicine

## 2021-12-16 DIAGNOSIS — Z1231 Encounter for screening mammogram for malignant neoplasm of breast: Secondary | ICD-10-CM

## 2022-01-26 ENCOUNTER — Ambulatory Visit
Admission: RE | Admit: 2022-01-26 | Discharge: 2022-01-26 | Disposition: A | Discharge status: Home / Self Care | Payer: Medicare HMO | Source: Ambulatory Visit | Attending: Internal Medicine | Admitting: Internal Medicine

## 2022-01-26 DIAGNOSIS — Z1231 Encounter for screening mammogram for malignant neoplasm of breast: Secondary | ICD-10-CM | POA: Diagnosis not present

## 2022-01-28 ENCOUNTER — Other Ambulatory Visit: Payer: Self-pay | Admitting: Internal Medicine

## 2022-01-28 DIAGNOSIS — R928 Other abnormal and inconclusive findings on diagnostic imaging of breast: Secondary | ICD-10-CM

## 2022-01-28 DIAGNOSIS — N6489 Other specified disorders of breast: Secondary | ICD-10-CM

## 2022-02-10 ENCOUNTER — Ambulatory Visit
Admission: RE | Admit: 2022-02-10 | Discharge: 2022-02-10 | Disposition: A | Discharge status: Home / Self Care | Payer: Medicare HMO | Source: Ambulatory Visit | Attending: Internal Medicine | Admitting: Internal Medicine

## 2022-02-10 DIAGNOSIS — R928 Other abnormal and inconclusive findings on diagnostic imaging of breast: Secondary | ICD-10-CM

## 2022-02-10 DIAGNOSIS — N6489 Other specified disorders of breast: Secondary | ICD-10-CM

## 2022-04-30 ENCOUNTER — Other Ambulatory Visit: Payer: Self-pay | Admitting: Otolaryngology

## 2022-04-30 DIAGNOSIS — R6884 Jaw pain: Secondary | ICD-10-CM

## 2022-05-05 ENCOUNTER — Ambulatory Visit
Admission: RE | Admit: 2022-05-05 | Discharge: 2022-05-05 | Disposition: A | Discharge status: Home / Self Care | Payer: Medicare HMO | Source: Ambulatory Visit | Attending: Otolaryngology | Admitting: Otolaryngology

## 2022-05-05 DIAGNOSIS — R6884 Jaw pain: Secondary | ICD-10-CM

## 2022-09-15 ENCOUNTER — Encounter: Payer: Self-pay | Admitting: Otolaryngology

## 2022-09-15 NOTE — Anesthesia Preprocedure Evaluation (Addendum)
Anesthesia Evaluation  Patient identified by MRN, date of birth, ID band Patient awake    Reviewed: Allergy & Precautions, H&P , NPO status , Patient's Chart, lab work & pertinent test results  History of Anesthesia Complications (+) PONV, DIFFICULT AIRWAY and history of anesthetic complications  Airway Mallampati: IV  TM Distance: <3 FB Neck ROM: Full  Mouth opening: Limited Mouth Opening  Dental no notable dental hx.  Caps upper central incisor:   Pulmonary neg pulmonary ROS, shortness of breath, sleep apnea  Known difficult intubation!!!!   Pulmonary exam normal breath sounds clear to auscultation       Cardiovascular hypertension, + CAD  Normal cardiovascular exam Rhythm:Regular Rate:Normal     Neuro/Psych  Headaches  Anxiety      Neuromuscular disease negative neurological ROS  negative psych ROS   GI/Hepatic negative GI ROS, Neg liver ROS,GERD  ,,  Endo/Other  negative endocrine ROS    Renal/GU Renal diseasenegative Renal ROS  negative genitourinary   Musculoskeletal negative musculoskeletal ROS (+) Arthritis ,    Abdominal   Peds negative pediatric ROS (+)  Hematology negative hematology ROS (+) Blood dyscrasia, anemia   Anesthesia Other Findings PONV   Sleep apnea Headache  GERD (gastroesophageal reflux disease) Neuromuscular disorder (HCC)  Post herpetic neuralgia Shingles  Sinus congestion Anxiety  Chronic kidney disease Irritable bowel syndrome (IBS)  Anemia Hypercholesteremia  Difficult intubation Hypertension  DDD (degenerative disc disease), cervical Dyspnea  Bradycardia Sinusitis  Difficult airway  Procedure Name: Intubation Date/Time: 10/05/2016 12:54 PM Performed by: Shirlee Limerick, DAVID Pre-anesthesia Checklist: Patient identified, Emergency Drugs available, Suction available and Patient being monitored Patient Re-evaluated:Patient Re-evaluated prior to inductionOxygen Delivery Method:  Circle system utilized Preoxygenation: Pre-oxygenation with 100% oxygen Intubation Type: IV induction Laryngoscope Size: Mac and 3 Grade View: Grade III Tube size: 7.0 mm Number of attempts: 1 Airway Equipment and Method: Bougie stylet Placement Confirmation: ETT inserted through vocal cords under direct vision,  positive ETCO2 and breath sounds checked- equal and bilateral Secured at: 21 cm Tube secured with: Tape Dental Injury: Teeth and Oropharynx as per pre-operative assessment  Difficulty Due To: Difficult Airway- due to anterior larynx Future Recommendations: Recommend- induction with short-acting agent, and alternative techniques readily available          Reproductive/Obstetrics negative OB ROS                              Anesthesia Physical Anesthesia Plan  ASA: 3  Anesthesia Plan: General ETT   Post-op Pain Management:    Induction: Intravenous  PONV Risk Score and Plan:   Airway Management Planned: Oral ETT  Additional Equipment:   Intra-op Plan:   Post-operative Plan: Extubation in OR  Informed Consent: I have reviewed the patients History and Physical, chart, labs and discussed the procedure including the risks, benefits and alternatives for the proposed anesthesia with the patient or authorized representative who has indicated his/her understanding and acceptance.     Dental Advisory Given  Plan Discussed with: Anesthesiologist, CRNA and Surgeon  Anesthesia Plan Comments: (Patient consented for risks of anesthesia including but not limited to:  - adverse reactions to medications - damage to eyes, teeth, lips or other oral mucosa - nerve damage due to positioning  - sore throat or hoarseness - Damage to heart, brain, nerves, lungs, other parts of body or loss of life  Patient voiced understanding.)         Anesthesia Quick  Evaluation

## 2022-09-17 NOTE — Discharge Instructions (Signed)
[facility] ENDOSCOPIC SINUS SURGERY [facility]  What is Functional Endoscopic Sinus Surgery?  The Surgery involves making the natural openings of the sinuses larger by removing the bony partitions that separate the sinuses from the nasal cavity.  The natural sinus lining is preserved as much as possible to allow the sinuses to resume normal function after the surgery.  In some patients nasal polyps (excessively swollen lining of the sinuses) may be removed to relieve obstruction of the sinus openings.  The surgery is performed through the nose using lighted scopes, which eliminates the need for incisions on the face.  A septoplasty is a different procedure which is sometimes performed with sinus surgery.  It involves straightening the boy partition that separates the two sides of your nose.  A crooked or deviated septum may need repair if is obstructing the sinuses or nasal airflow.  Turbinate reduction is also often performed during sinus surgery.  The turbinates are bony proturberances from the side walls of the nose which swell and can obstruct the nose in patients with sinus and allergy problems.  Their size can be surgically reduced to help relieve nasal obstruction.  What Can Sinus Surgery Do For Me?  Sinus surgery can reduce the frequency of sinus infections requiring antibiotic treatment.  This can provide improvement in nasal congestion, post-nasal drainage, facial pressure and nasal obstruction.  Surgery will NOT prevent you from ever having an infection again, so it usually only for patients who get infections 4 or more times yearly requiring antibiotics, or for infections that do not clear with antibiotics.  It will not cure nasal allergies, so patients with allergies may still require medication to treat their allergies after surgery. Surgery may improve headaches related to sinusitis, however, some people will continue to  require medication to control sinus headaches related to allergies.  Surgery will do nothing for other forms of headache (migraine, tension or cluster).  What Are the Risks of Endoscopic Sinus Surgery?  Current techniques allow surgery to be performed safely with little risk, however, there are rare complications that patients should be aware of.  Because the sinuses are located around the eyes, there is risk of eye injury, including blindness, though again, this would be quite rare. This is usually a result of bleeding behind the eye during surgery, which can effect vision, though there are treatments to protect the vision and prevent permanent injury. More serious complications would include bleeding inside the brain cavity or damage to the brain.This happens when the fluid around the brain leaks out into the sinus cavity.  Again, all of these complications are uncommon, and spinal fluid leaks can be safely managed surgically if they occur.  The most common complication of sinus surgery is bleeding from the nose, which may require packing or cauterization of the nose.  Patients with polyps may experience recurrence of the polyps that would require revision surgery.  Alterations of sense of smell or injury to the tear ducts are also rare complications.   What is the Surgery Like, and what is the Recovery?  The Surgery usually takes a couple of hours to perform, and is usually performed under a general anesthetic (completely asleep).  Patients are usually discharged home after a couple of hours.  Sometimes during surgery it is necessary to pack the nose to control bleeding, and the packing is left in place for 24 - 48 hours, and removed by your surgeon.  If   a septoplasty was performed during the procedure, there is often a splint placed which must be removed after 5-7 days.   Discomfort: Pain is usually mild to moderate, and can be controlled by prescription pain medication or acetaminophen (Tylenol).   Aspirin, Ibuprofen (Advil, Motrin), or Naprosyn (Aleve) should be avoided, as they can cause increased bleeding.  Most patients feel sinus pressure like they have a bad head cold for several days.  Sleeping with your head elevated can help reduce swelling and facial pressure, as can ice packs over the face.  A humidifier may be helpful to keep the mucous and blood from drying in the nose.   Diet: There are no specific diet restrictions, however, you should generally start with clear liquids and a light diet of bland foods because the anesthetic can cause some nausea.  Advance your diet depending on how your stomach feels.  Taking your pain medication with food will often help reduce stomach upset which pain medications can cause.  Nasal Saline Irrigation: It is important to remove blood clots and dried mucous from the nose as it is healing.  This is done by having you irrigate the nose at least 3 - 4 times daily with a salt water solution.  We recommend using NeilMed Sinus Rinse (available at the drug store).  Fill the squeeze bottle with the solution, bend over a sink, and insert the tip of the squeeze bottle into the nose  of an inch.  Point the tip of the squeeze bottle towards the inside corner of the eye on the same side your irrigating.  Squeeze the bottle and gently irrigate the nose.  If you bend forward as you do this, most of the fluid will flow back out of the nose, instead of down your throat.   The solution should be warm, near body temperature, when you irrigate.   Each time you irrigate, you should use a full squeeze bottle.   Note that if you are instructed to use Nasal Steroid Sprays at any time after your surgery, irrigate with saline BEFORE using the steroid spray, so you do not wash it all out of the nose. Another product, Nasal Saline Gel (such as AYR Nasal Saline Gel) can be applied in each nostril 3 - 4 times daily to moisture the nose and reduce scabbing or crusting.  Bleeding:   Bloody drainage from the nose can be expected for several days, and patients are instructed to irrigate their nose frequently with salt water to help remove mucous and blood clots.  The drainage may be dark red or brown, though some fresh blood may be seen intermittently, especially after irrigation.  Do not blow you nose, as bleeding may occur. If you must sneeze, keep your mouth open to allow air to escape through your mouth.  If heavy bleeding occurs: Irrigate the nose with saline to rinse out clots, then spray the nose 3 - 4 times with Afrin Nasal Decongestant Spray.  The spray will constrict the blood vessels to slow bleeding.  Pinch the lower half of your nose shut to apply pressure, and lay down with your head elevated.  Ice packs over the nose may help as well. If bleeding persists despite these measures, you should notify your doctor.  Do not use the Afrin routinely to control nasal congestion after surgery, as it can result in worsening congestion and may affect healing.     Activity: Return to work varies among patients. Most patients will be out   of work at least 5 - 7 days to recover.  Patient may return to work after they are off of narcotic pain medication, and feeling well enough to perform the functions of their job.  Patients must avoid heavy lifting (over 10 pounds) or strenuous physical for 2 weeks after surgery, so your employer may need to assign you to light duty, or keep you out of work longer if light duty is not possible.  NOTE: you should not drive, operate dangerous machinery, do any mentally demanding tasks or make any important legal or financial decisions while on narcotic pain medication and recovering from the general anesthetic.    Call Your Doctor Immediately if You Have Any of the Following: Bleeding that you cannot control with the above measures Loss of vision, double vision, bulging of the eye or black eyes. Fever over 101 degrees Neck stiffness with severe headache,  fever, nausea and change in mental state. You are always encouraged to call anytime with concerns, however, please call with requests for pain medication refills during office hours.  Office Endoscopy: During follow-up visits your doctor will remove any packing or splints that may have been placed and evaluate and clean your sinuses endoscopically.  Topical anesthetic will be used to make this as comfortable as possible, though you may want to take your pain medication prior to the visit.  How often this will need to be done varies from patient to patient.  After complete recovery from the surgery, you may need follow-up endoscopy from time to time, particularly if there is concern of recurrent infection or nasal polyps.  

## 2022-09-21 ENCOUNTER — Encounter: Payer: Self-pay | Admitting: Otolaryngology

## 2022-09-21 ENCOUNTER — Encounter
Admission: RE | Disposition: A | Discharge status: Home / Self Care | Payer: Self-pay | Source: Home / Self Care | Attending: Otolaryngology

## 2022-09-21 ENCOUNTER — Ambulatory Visit: Payer: Medicare HMO | Admitting: Anesthesiology

## 2022-09-21 ENCOUNTER — Ambulatory Visit
Admission: RE | Admit: 2022-09-21 | Discharge: 2022-09-21 | Disposition: A | Discharge status: Home / Self Care | Payer: Medicare HMO | Attending: Otolaryngology | Admitting: Otolaryngology

## 2022-09-21 ENCOUNTER — Other Ambulatory Visit: Payer: Self-pay

## 2022-09-21 DIAGNOSIS — J329 Chronic sinusitis, unspecified: Secondary | ICD-10-CM | POA: Insufficient documentation

## 2022-09-21 HISTORY — PX: FRONTAL SINUS EXPLORATION: SHX6591

## 2022-09-21 HISTORY — PX: SPHENOIDECTOMY: SHX2421

## 2022-09-21 HISTORY — PX: MAXILLARY ANTROSTOMY: SHX2003

## 2022-09-21 HISTORY — DX: Atherosclerotic heart disease of native coronary artery without angina pectoris: I25.10

## 2022-09-21 HISTORY — PX: ETHMOIDECTOMY: SHX5197

## 2022-09-21 HISTORY — PX: IMAGE GUIDED SINUS SURGERY: SHX6570

## 2022-09-21 LAB — AEROBIC/ANAEROBIC CULTURE W GRAM STAIN (SURGICAL/DEEP WOUND)

## 2022-09-21 SURGERY — SINUS SURGERY, WITH IMAGING GUIDANCE
Anesthesia: General | Site: Nose | Laterality: Bilateral

## 2022-09-21 MED ORDER — ACETAMINOPHEN 10 MG/ML IV SOLN: 1000.0000 mg | Freq: Once | INTRAVENOUS | Status: DC

## 2022-09-21 MED ORDER — FENTANYL CITRATE (PF) 100 MCG/2ML IJ SOLN
INTRAMUSCULAR | Status: DC | PRN
  Administered 2022-09-21: 100 ug via INTRAVENOUS

## 2022-09-21 MED ORDER — PROPOFOL 10 MG/ML IV BOLUS
INTRAVENOUS | Status: DC | PRN
  Administered 2022-09-21: 150 mg via INTRAVENOUS

## 2022-09-21 MED ORDER — EPHEDRINE SULFATE (PRESSORS) 50 MG/ML IJ SOLN
INTRAMUSCULAR | Status: DC | PRN
  Administered 2022-09-21: 10 mg via INTRAVENOUS

## 2022-09-21 MED ORDER — OXYCODONE HCL 5 MG PO TABS
10.0000 mg | ORAL_TABLET | Freq: Once | ORAL | Status: AC
  Administered 2022-09-21: 10 mg via ORAL

## 2022-09-21 MED ORDER — LIDOCAINE HCL (CARDIAC) PF 100 MG/5ML IV SOSY
PREFILLED_SYRINGE | INTRAVENOUS | Status: DC | PRN
  Administered 2022-09-21: 80 mg via INTRAVENOUS

## 2022-09-21 MED ORDER — OXYMETAZOLINE HCL 0.05 % NA SOLN
NASAL | Status: DC | PRN
  Administered 2022-09-21: 1 via TOPICAL

## 2022-09-21 MED ORDER — LIDOCAINE-EPINEPHRINE 1 %-1:100000 IJ SOLN
INTRAMUSCULAR | Status: DC | PRN
  Administered 2022-09-21: 9 mL

## 2022-09-21 MED ORDER — HYDROCODONE-ACETAMINOPHEN 5-325 MG PO TABS: 1.0000 | ORAL_TABLET | Freq: Four times a day (QID) | ORAL | 0 refills | Status: AC | PRN

## 2022-09-21 MED ORDER — PROPOFOL 500 MG/50ML IV EMUL
INTRAVENOUS | Status: DC | PRN
  Administered 2022-09-21: 140 ug/kg/min via INTRAVENOUS

## 2022-09-21 MED ORDER — LACTATED RINGERS IV SOLN: INTRAVENOUS | Status: DC

## 2022-09-21 MED ORDER — CEPHALEXIN 500 MG PO CAPS: 500.0000 mg | ORAL_CAPSULE | Freq: Two times a day (BID) | ORAL | 0 refills | Status: AC

## 2022-09-21 MED ORDER — SUCCINYLCHOLINE CHLORIDE 200 MG/10ML IV SOSY
PREFILLED_SYRINGE | INTRAVENOUS | Status: DC | PRN
  Administered 2022-09-21: 140 mg via INTRAVENOUS

## 2022-09-21 MED ORDER — MIDAZOLAM HCL 5 MG/5ML IJ SOLN
INTRAMUSCULAR | Status: DC | PRN
  Administered 2022-09-21: 2 mg via INTRAVENOUS

## 2022-09-21 MED ORDER — ROCURONIUM BROMIDE 100 MG/10ML IV SOLN
INTRAVENOUS | Status: DC | PRN
  Administered 2022-09-21: 40 mg via INTRAVENOUS

## 2022-09-21 MED ORDER — ONDANSETRON HCL 4 MG/2ML IJ SOLN
INTRAMUSCULAR | Status: DC | PRN
  Administered 2022-09-21: 4 mg via INTRAVENOUS

## 2022-09-21 MED ORDER — DEXAMETHASONE SODIUM PHOSPHATE 4 MG/ML IJ SOLN
INTRAMUSCULAR | Status: DC | PRN
  Administered 2022-09-21: 4 mg via INTRAVENOUS

## 2022-09-21 MED ORDER — SUGAMMADEX SODIUM 200 MG/2ML IV SOLN
INTRAVENOUS | Status: DC | PRN
  Administered 2022-09-21: 138.8 mg via INTRAVENOUS

## 2022-09-21 SURGICAL SUPPLY — 31 items
BLADE SHAVER TRUDI 4 15 DEG (ENT DISPOSABLE) ×2 IMPLANT
BLADE SHAVER TRUDI STR 4 (ENT DISPOSABLE) ×2 IMPLANT
CABLE TRUDI DISPOSABLE (ENT DISPOSABLE) ×4 IMPLANT
CANISTER SUCT 1200ML W/VALVE (MISCELLANEOUS) ×2 IMPLANT
COAGULATOR SUCT 8FR VV (MISCELLANEOUS) ×2 IMPLANT
DEVICE INFLATION SEID (MISCELLANEOUS) IMPLANT
DRESSING NASL FOAM PST OP SINU (MISCELLANEOUS) IMPLANT
DRSG NASAL FOAM POST OP SINU (MISCELLANEOUS)
ELECT REM PT RETURN 9FT ADLT (ELECTROSURGICAL) ×1
ELECTRODE REM PT RTRN 9FT ADLT (ELECTROSURGICAL) ×2 IMPLANT
GAUZE SPONGE 4X4 12PLY STRL (GAUZE/BANDAGES/DRESSINGS) IMPLANT
GLOVE SURG ENC MOIS LTX SZ7.5 (GLOVE) ×4 IMPLANT
GOWN STRL REUS W/ TWL LRG LVL3 (GOWN DISPOSABLE) ×2 IMPLANT
GOWN STRL REUS W/TWL LRG LVL3 (GOWN DISPOSABLE) ×1
IV NS 500ML (IV SOLUTION) ×1
IV NS 500ML BAXH (IV SOLUTION) ×2 IMPLANT
KIT TURNOVER KIT A (KITS) ×2 IMPLANT
NS IRRIG 500ML POUR BTL (IV SOLUTION) ×2 IMPLANT
PACK ENT CUSTOM (PACKS) ×2 IMPLANT
PACKING NASAL EPIS 4X2.4 XEROG (MISCELLANEOUS) IMPLANT
PACKING NASAL HEMOPORE 8 (HEMOSTASIS) IMPLANT
PATTIES SURGICAL .5 X3 (DISPOSABLE) ×2 IMPLANT
SOL ANTI-FOG 6CC FOG-OUT (MISCELLANEOUS) ×2 IMPLANT
STRAP BODY AND KNEE 60X3 (MISCELLANEOUS) IMPLANT
SUCTION COAG ELEC 10 HAND CTRL (ELECTROSURGICAL) ×2 IMPLANT
SYR 10ML LL (SYRINGE) ×2 IMPLANT
SYSTEM BALLOON SINUPLASTY 6X16 (SINUPLASTY) IMPLANT
TRACKER DISPOSABLE PAITIENT (MISCELLANEOUS) ×2 IMPLANT
TUBING CONNECTING 10 (TUBING) ×2 IMPLANT
TUBING IRRIGATION BIEN-AIR (TUBING) ×2 IMPLANT
WATER STERILE IRR 250ML POUR (IV SOLUTION) IMPLANT

## 2022-09-21 NOTE — Transfer of Care (Signed)
Immediate Anesthesia Transfer of Care Note  Patient: [name]  Procedure(s) Performed: IMAGE GUIDED SINUS SURGERY (Bilateral: Nose) TOTAL ETHMOIDECTOMY (Bilateral: Nose) SPHENOIDECTOMY WITH TISSUE REMOVAL (Bilateral: Nose) MAXILLARY ANTROSTOMY WITH TISSUE REMOVAL (Bilateral: Nose) FRONTAL SINUS EXPLORATION (Bilateral: Nose)  Patient Location: PACU  Anesthesia Type: General ETT  Level of Consciousness: awake, alert  and patient cooperative  Airway and Oxygen Therapy: Patient Spontanous Breathing and Patient connected to supplemental oxygen  Post-op Assessment: Post-op Vital signs reviewed, Patient's Cardiovascular Status Stable, Respiratory Function Stable, Patent Airway and No signs of Nausea or vomiting  Post-op Vital Signs: Reviewed and stable  Complications: No notable events documented.

## 2022-09-21 NOTE — Anesthesia Postprocedure Evaluation (Signed)
Anesthesia Post Note  Patient: Beth Beck  Procedure(s) Performed: IMAGE GUIDED SINUS SURGERY (Bilateral: Nose) TOTAL ETHMOIDECTOMY (Bilateral: Nose) SPHENOIDECTOMY WITH TISSUE REMOVAL (Bilateral: Nose) MAXILLARY ANTROSTOMY WITH TISSUE REMOVAL (Bilateral: Nose) FRONTAL SINUS EXPLORATION (Bilateral: Nose)  Patient location during evaluation: PACU Anesthesia Type: General Level of consciousness: awake and alert Pain management: pain level controlled Vital Signs Assessment: post-procedure vital signs reviewed and stable Respiratory status: spontaneous breathing, nonlabored ventilation, respiratory function stable and patient connected to nasal cannula oxygen Cardiovascular status: blood pressure returned to baseline and stable Postop Assessment: no apparent nausea or vomiting Anesthetic complications: no   No notable events documented.   Last Vitals:  Vitals:   09/21/22 1315 09/21/22 1320  BP: (!) 152/74   Pulse: 65 64  Resp: 18 13  Temp:    SpO2: 99% 96%    Last Pain:  Vitals:   09/21/22 1315  TempSrc:   PainSc: 4                  Damilola Flamm C Sybel Standish

## 2022-09-21 NOTE — Op Note (Signed)
09/21/2022  12:28 PM    Beth Beck  [id number]   Pre-Op Diagnosis:  Sinusitis, chronic  Post-op Diagnosis: Sinusitis, chronic  Procedure:  1)  Image Guided Sinus Surgery,   2)  Bilateral Endoscopic Maxillary Antrostomy with Tissue Removal   3)  Bilateral Frontal Sinusotomy   4)  Bilateral Total Ethmoidectomy   5)  Bilateral Sphenoidotomy with tissue removal    Surgeon:  Sandi Mealy  Anesthesia:  General endotracheal  EBL: Less than 50 cc  Complications:  None  Findings: Diffuse polypoid mucosal thickening with small polyps obstructing the maxillary antrostomies, ethmoids, frontal recess, and sphenoid recess.  Postsurgical change from prior total ethmoidectomy, maxillary antrostomies, and sphenoidotomy.  Purulent secretions noted bilaterally in all of the sinuses.    Procedure: After the patient was identified in holding and the benefits of the procedure were reviewed as well as the consent and risks, the patient was taken to the operating room and with the patient in a comfortable supine position,  general orotracheal anesthesia was induced without difficulty.  A proper time-out was performed.  The Trudi image guidance system was set up and calibrated in the normal fashion and felt to be acceptable.  Next 1% Xylocaine with 1:100,000 epinephrine was infiltrated into the inferior turbinates, septum, and anterior middle turbinates bilaterally.  Several minutes were allowed for this to take effect.  Cottonoid pledgets soaked in Afrin were placed into both nasal cavities and left while the patient was prepped and draped in the standard fashion. The image guided suction was calibrated and used to inspect known points in the nasal cavity to assess accuracy of the image guided system. Accuracy was felt to be excellent.   The left middle turbinate was medialized and the microdebrider used to open an obstructed left maxillary antrostomy which was obstructed by polypoid mucosal  thickening and small polyps.  A backbiting through-cutting forcep was used to help debride the antrostomy further.  Purulent material was suctioned from the sinus and cultured.  The sinus was vigorously irrigated with saline to clear infected debris.  Curved forceps were used to reach into the sinus to debride any residual polypoid tissue.  Next the left anterior ethmoid sinuses were dissected beginning inferomedially, and continuing back into the posterior ethmoids.  The patient had had prior total ethmoidectomies and basically the ethmoids were obstructed by polyps and inflamed mucosa.  This was carefully debrided with the microdebrider.  Cup forceps were used to obtain tissue for biopsy purposes.  The ethmoids were cleaned out back to the sphenoid recess and up to the skull base superiorly.  Purulence was also noted and small pockets within this polypoid debris.  The image guided suction was referenced frequently to assess the anatomy.  The dissection proceeded posteriorly into the sphenoid sinus which had been previously widely opened but was obstructed by polyps and inflamed mucosa.  The microdebrider was used to carefully remove the polyps, opening the sphenoid os, taking care to avoid dissection superiorly and laterally.  The sphenoid sinus had purulent material in it which was suctioned out and thoroughly irrigated with saline to remove all debris.  Dissection proceeded anteriorly and superiorly into the left frontal recess which was dissected utilizing a 30 and then a 70 scope and frontal curved instruments. The curved image guided suction was used during this dissection to frequently reassess the anatomy on the CT scan. The frontal recess was dissected until a suction could be passed up into the region of the frontal  sinus.  The sinus was then irrigated with sterile saline.  Attention was then turned to the right side where the same procedure was performed, opening the maxillary sinus, ethmoid  cavities, sphenoid sinus and the frontal recess in the same fashion as described above.  The sinuses were irrigated as described above to make sure all purulent debris was removed.  The nose was suctioned and inspected. Hemopore  absorbable sinus packing was then placed in the ethmoid cavities bilaterally.   The patient was then returned to the anesthesiologist for awakening and taken to recovery room in good condition postoperatively.  Disposition:   PACU and d/c home  Plan: Ice, elevation, narcotic analgesia and prophylactic antibiotics. Begin sinus irrigations with saline tomorrow, irrigating 3-4 times daily. Return to the office in 7 days.  Return to work in 7-10 days, no strenuous activities for two weeks.   Sandi Mealy 09/21/2022 12:28 PM

## 2022-09-21 NOTE — H&P (Signed)
History and physical reviewed and will be scanned in later. No change in medical status reported by the patient or family, appears stable for surgery. All questions regarding the procedure answered, and patient (or family if a child) expressed understanding of the procedure. ? ?Beth Beck ?@TODAY@ ?

## 2022-09-22 LAB — AEROBIC/ANAEROBIC CULTURE W GRAM STAIN (SURGICAL/DEEP WOUND)

## 2022-09-24 LAB — AEROBIC/ANAEROBIC CULTURE W GRAM STAIN (SURGICAL/DEEP WOUND)

## 2022-09-26 LAB — AEROBIC/ANAEROBIC CULTURE W GRAM STAIN (SURGICAL/DEEP WOUND)

## 2023-01-06 ENCOUNTER — Other Ambulatory Visit: Payer: Self-pay | Admitting: Student

## 2023-01-06 DIAGNOSIS — G629 Polyneuropathy, unspecified: Secondary | ICD-10-CM

## 2023-01-12 ENCOUNTER — Other Ambulatory Visit: Payer: Self-pay | Admitting: Internal Medicine

## 2023-01-12 DIAGNOSIS — Z1231 Encounter for screening mammogram for malignant neoplasm of breast: Secondary | ICD-10-CM

## 2023-01-20 ENCOUNTER — Encounter: Payer: Self-pay | Admitting: Student

## 2023-01-24 ENCOUNTER — Ambulatory Visit
Admission: RE | Admit: 2023-01-24 | Discharge: 2023-01-24 | Disposition: A | Discharge status: Home / Self Care | Payer: Medicare HMO | Source: Ambulatory Visit | Attending: Student | Admitting: Student

## 2023-01-24 DIAGNOSIS — G629 Polyneuropathy, unspecified: Secondary | ICD-10-CM

## 2023-02-01 ENCOUNTER — Ambulatory Visit
Admission: RE | Admit: 2023-02-01 | Discharge: 2023-02-01 | Disposition: A | Discharge status: Home / Self Care | Payer: Medicare HMO | Source: Ambulatory Visit | Attending: Internal Medicine | Admitting: Internal Medicine

## 2023-02-01 DIAGNOSIS — Z1231 Encounter for screening mammogram for malignant neoplasm of breast: Secondary | ICD-10-CM | POA: Insufficient documentation

## 2023-05-05 DIAGNOSIS — N390 Urinary tract infection, site not specified: Secondary | ICD-10-CM | POA: Diagnosis not present

## 2023-05-05 DIAGNOSIS — G629 Polyneuropathy, unspecified: Secondary | ICD-10-CM | POA: Diagnosis not present

## 2023-05-05 DIAGNOSIS — B0229 Other postherpetic nervous system involvement: Secondary | ICD-10-CM | POA: Diagnosis not present

## 2023-05-05 DIAGNOSIS — R001 Bradycardia, unspecified: Secondary | ICD-10-CM | POA: Diagnosis not present

## 2023-05-05 DIAGNOSIS — G5601 Carpal tunnel syndrome, right upper limb: Secondary | ICD-10-CM | POA: Diagnosis not present

## 2023-05-09 ENCOUNTER — Other Ambulatory Visit (HOSPITAL_COMMUNITY): Payer: Self-pay

## 2023-05-18 DIAGNOSIS — H6123 Impacted cerumen, bilateral: Secondary | ICD-10-CM | POA: Diagnosis not present

## 2023-05-18 DIAGNOSIS — J320 Chronic maxillary sinusitis: Secondary | ICD-10-CM | POA: Diagnosis not present

## 2023-06-27 DIAGNOSIS — J320 Chronic maxillary sinusitis: Secondary | ICD-10-CM | POA: Diagnosis not present

## 2023-07-05 DIAGNOSIS — H04123 Dry eye syndrome of bilateral lacrimal glands: Secondary | ICD-10-CM | POA: Diagnosis not present

## 2023-07-05 DIAGNOSIS — Z961 Presence of intraocular lens: Secondary | ICD-10-CM | POA: Diagnosis not present

## 2023-07-05 DIAGNOSIS — H5203 Hypermetropia, bilateral: Secondary | ICD-10-CM | POA: Diagnosis not present

## 2023-07-05 DIAGNOSIS — H52223 Regular astigmatism, bilateral: Secondary | ICD-10-CM | POA: Diagnosis not present

## 2023-07-05 DIAGNOSIS — H524 Presbyopia: Secondary | ICD-10-CM | POA: Diagnosis not present

## 2023-07-05 DIAGNOSIS — H43813 Vitreous degeneration, bilateral: Secondary | ICD-10-CM | POA: Diagnosis not present

## 2023-07-12 DIAGNOSIS — D225 Melanocytic nevi of trunk: Secondary | ICD-10-CM | POA: Diagnosis not present

## 2023-07-12 DIAGNOSIS — D2271 Melanocytic nevi of right lower limb, including hip: Secondary | ICD-10-CM | POA: Diagnosis not present

## 2023-07-12 DIAGNOSIS — D2272 Melanocytic nevi of left lower limb, including hip: Secondary | ICD-10-CM | POA: Diagnosis not present

## 2023-07-12 DIAGNOSIS — D2261 Melanocytic nevi of right upper limb, including shoulder: Secondary | ICD-10-CM | POA: Diagnosis not present

## 2023-07-12 DIAGNOSIS — L821 Other seborrheic keratosis: Secondary | ICD-10-CM | POA: Diagnosis not present

## 2023-07-12 DIAGNOSIS — L570 Actinic keratosis: Secondary | ICD-10-CM | POA: Diagnosis not present

## 2023-07-12 DIAGNOSIS — D2262 Melanocytic nevi of left upper limb, including shoulder: Secondary | ICD-10-CM | POA: Diagnosis not present

## 2023-07-12 DIAGNOSIS — Z08 Encounter for follow-up examination after completed treatment for malignant neoplasm: Secondary | ICD-10-CM | POA: Diagnosis not present

## 2023-07-12 DIAGNOSIS — Z85828 Personal history of other malignant neoplasm of skin: Secondary | ICD-10-CM | POA: Diagnosis not present

## 2023-07-19 DIAGNOSIS — R7309 Other abnormal glucose: Secondary | ICD-10-CM | POA: Diagnosis not present

## 2023-07-19 DIAGNOSIS — Z79899 Other long term (current) drug therapy: Secondary | ICD-10-CM | POA: Diagnosis not present

## 2023-07-19 DIAGNOSIS — E782 Mixed hyperlipidemia: Secondary | ICD-10-CM | POA: Diagnosis not present

## 2023-07-19 DIAGNOSIS — I10 Essential (primary) hypertension: Secondary | ICD-10-CM | POA: Diagnosis not present

## 2023-07-26 DIAGNOSIS — Z0000 Encounter for general adult medical examination without abnormal findings: Secondary | ICD-10-CM | POA: Diagnosis not present

## 2023-07-26 DIAGNOSIS — E782 Mixed hyperlipidemia: Secondary | ICD-10-CM | POA: Diagnosis not present

## 2023-07-26 DIAGNOSIS — I2510 Atherosclerotic heart disease of native coronary artery without angina pectoris: Secondary | ICD-10-CM | POA: Diagnosis not present

## 2023-07-26 DIAGNOSIS — R399 Unspecified symptoms and signs involving the genitourinary system: Secondary | ICD-10-CM | POA: Diagnosis not present

## 2023-07-26 DIAGNOSIS — M79602 Pain in left arm: Secondary | ICD-10-CM | POA: Diagnosis not present

## 2023-07-26 DIAGNOSIS — I10 Essential (primary) hypertension: Secondary | ICD-10-CM | POA: Diagnosis not present

## 2023-07-26 DIAGNOSIS — F411 Generalized anxiety disorder: Secondary | ICD-10-CM | POA: Diagnosis not present

## 2023-08-15 DIAGNOSIS — M25511 Pain in right shoulder: Secondary | ICD-10-CM | POA: Diagnosis not present

## 2023-08-15 DIAGNOSIS — M25512 Pain in left shoulder: Secondary | ICD-10-CM | POA: Diagnosis not present

## 2023-08-15 DIAGNOSIS — G8929 Other chronic pain: Secondary | ICD-10-CM | POA: Diagnosis not present

## 2023-08-15 DIAGNOSIS — Z9889 Other specified postprocedural states: Secondary | ICD-10-CM | POA: Diagnosis not present

## 2023-08-15 DIAGNOSIS — M5030 Other cervical disc degeneration, unspecified cervical region: Secondary | ICD-10-CM | POA: Diagnosis not present

## 2023-08-25 DIAGNOSIS — J339 Nasal polyp, unspecified: Secondary | ICD-10-CM | POA: Diagnosis not present

## 2023-08-25 DIAGNOSIS — J320 Chronic maxillary sinusitis: Secondary | ICD-10-CM | POA: Diagnosis not present

## 2023-09-16 DIAGNOSIS — G629 Polyneuropathy, unspecified: Secondary | ICD-10-CM | POA: Diagnosis not present

## 2023-09-16 DIAGNOSIS — G5601 Carpal tunnel syndrome, right upper limb: Secondary | ICD-10-CM | POA: Diagnosis not present

## 2023-09-16 DIAGNOSIS — R001 Bradycardia, unspecified: Secondary | ICD-10-CM | POA: Diagnosis not present

## 2023-09-21 ENCOUNTER — Ambulatory Visit: Admitting: Urology

## 2023-09-21 DIAGNOSIS — E041 Nontoxic single thyroid nodule: Secondary | ICD-10-CM | POA: Diagnosis not present

## 2023-09-21 DIAGNOSIS — R001 Bradycardia, unspecified: Secondary | ICD-10-CM | POA: Diagnosis not present

## 2023-09-21 DIAGNOSIS — R1310 Dysphagia, unspecified: Secondary | ICD-10-CM | POA: Diagnosis not present

## 2023-09-21 DIAGNOSIS — R5383 Other fatigue: Secondary | ICD-10-CM | POA: Diagnosis not present

## 2023-09-21 DIAGNOSIS — J329 Chronic sinusitis, unspecified: Secondary | ICD-10-CM | POA: Diagnosis not present

## 2023-10-12 DIAGNOSIS — E041 Nontoxic single thyroid nodule: Secondary | ICD-10-CM | POA: Diagnosis not present

## 2023-10-12 DIAGNOSIS — R5383 Other fatigue: Secondary | ICD-10-CM | POA: Diagnosis not present

## 2023-10-15 IMAGING — CT CT NECK W/ CM
5 series · 16 of 33 positions shown, 18 images · IV contrast (agent unspecified)
Comparison: None.

CLINICAL DATA: Sore throat and hoarseness. Sinus drainage. Neck
pain and hoarseness. Palpable lump right submandibular region.

EXAM:
CT NECK WITH CONTRAST
TECHNIQUE: Multidetector CT imaging of the neck was performed using the
standard protocol following the bolus administration of intravenous
contrast.

[Series 2: axial neck neck (person_name) 2.00 · axial · 0.55mm/px · z∈[-669,-543]mm · 3 of 127 slices shown]
[im 32/127  bone]
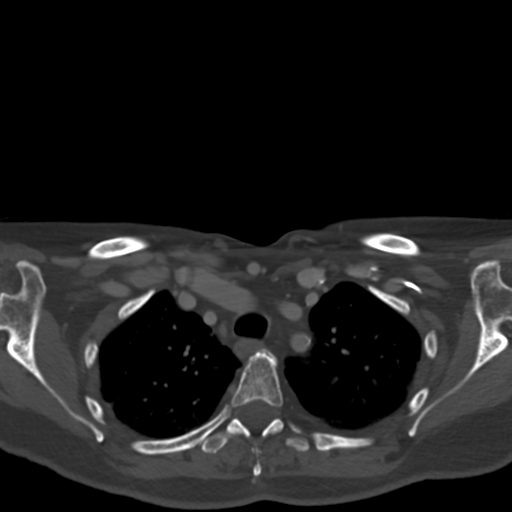
[im 64/127  bone]
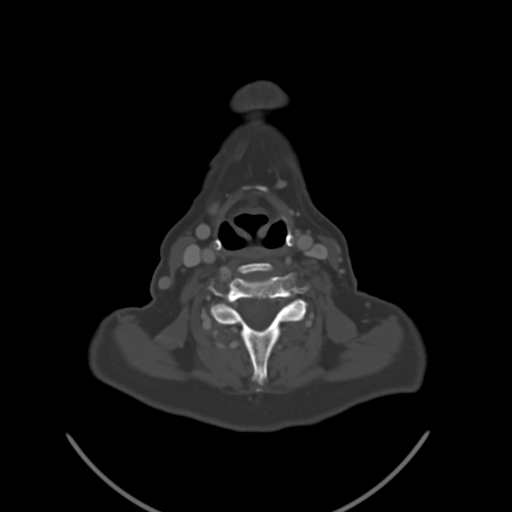
[im 95/127  bone]
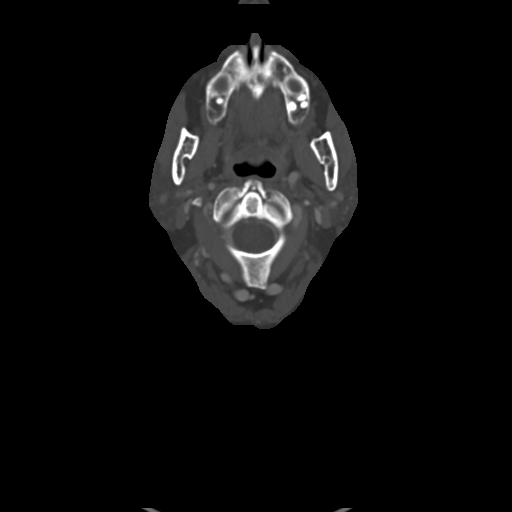

[Series 3: axial bone neck 2.00 · axial · 0.55mm/px · z∈[-647,-563]mm · 2 of 127 slices shown]
[im 43/127  bone]
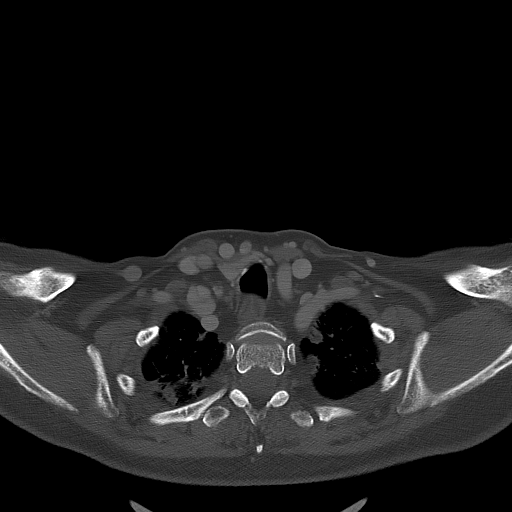
[im 85/127  bone]
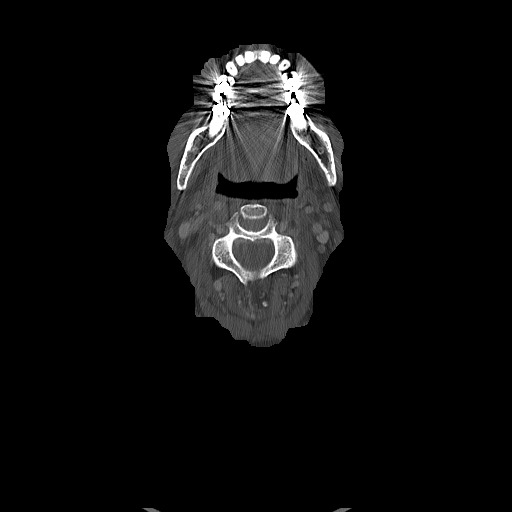

[Series 4: coronal neck neck (person_name) 2.00 cor · coronal · 0.55mm/px · 3 of 133 slices shown]
[im 27/133  bone]
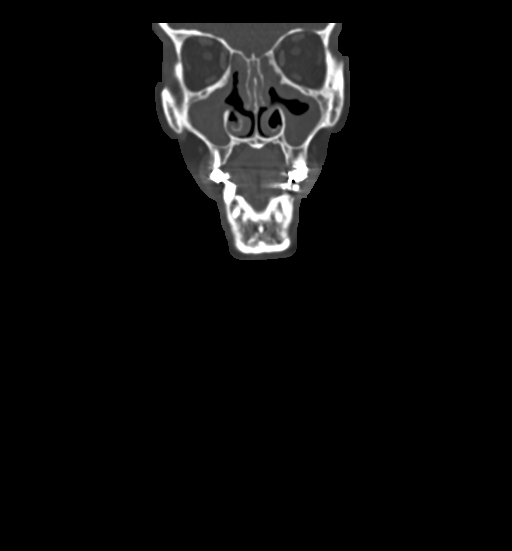
[im 53/133  bone]
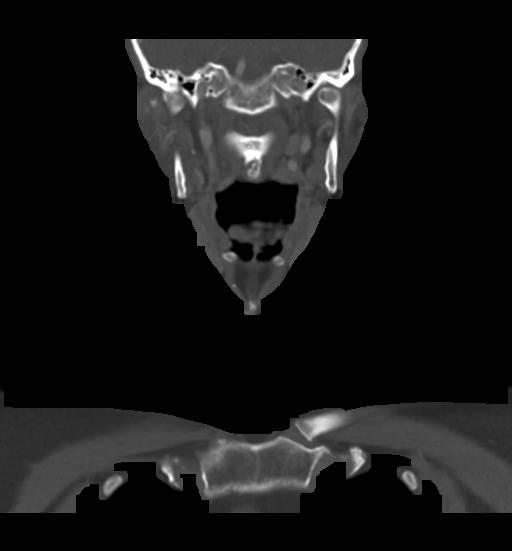
[im 80/133  bone]
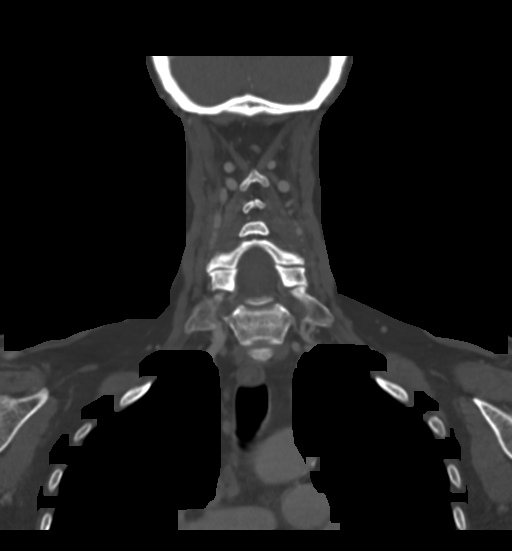

[Series 6: sagittal neck neck (person_name) 2.00 sag · sagittal · 0.52mm/px · 5 of 140 slices shown, 6 images]
[im 47/140  bone]
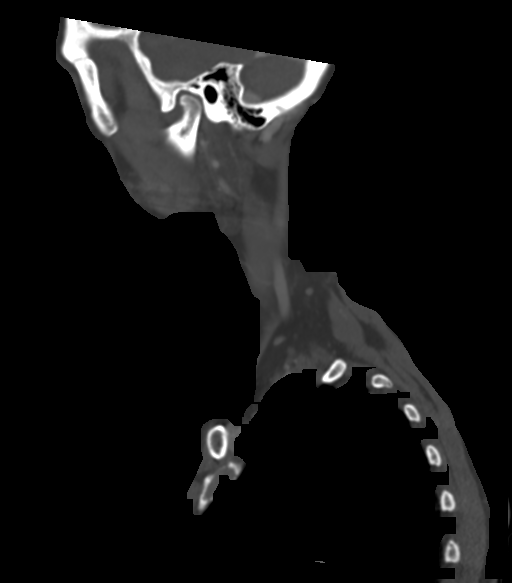
[im 58/140  bone]
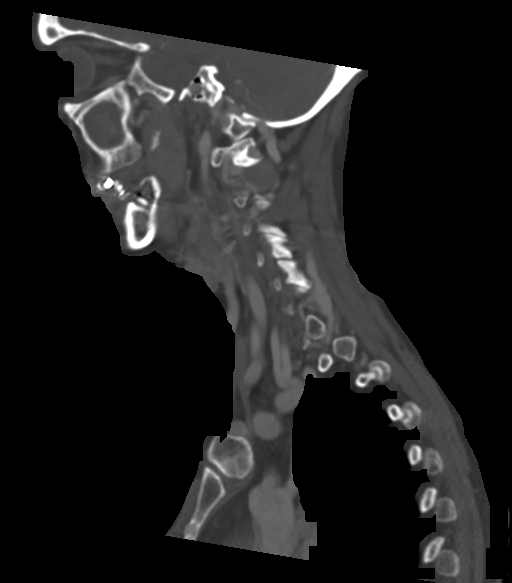
[im 70/140  soft-tissue]
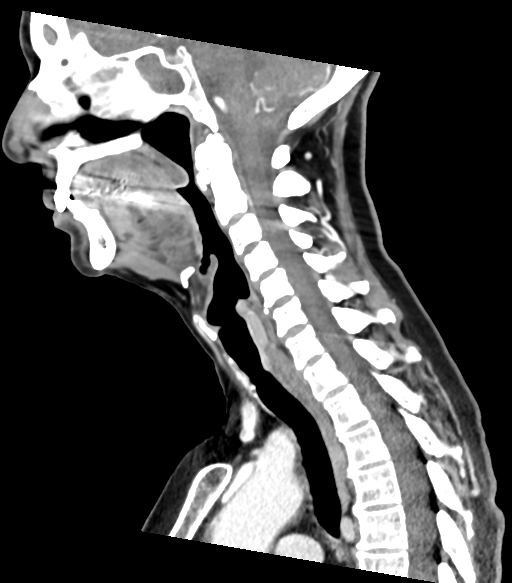
[im 70/140  bone]
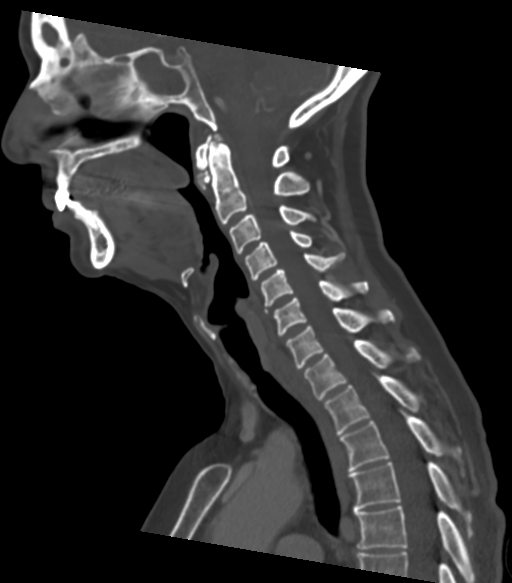
[im 82/140  bone]
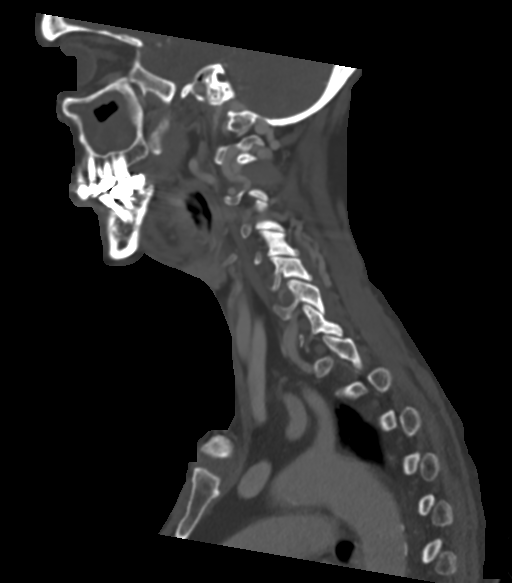
[im 93/140  bone]
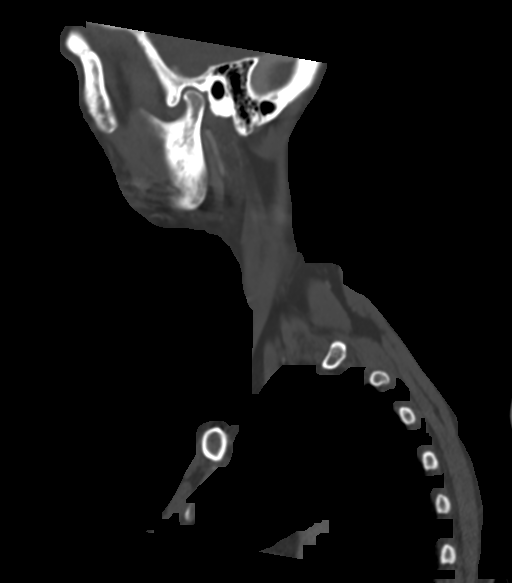

[Series 8: ax oropharynx neck neck (person_name) 2.00 ax · axial · 0.52mm/px · z∈[-697,-551]mm · 3 of 150 slices shown, 4 images]
[im 38/150  soft-tissue]
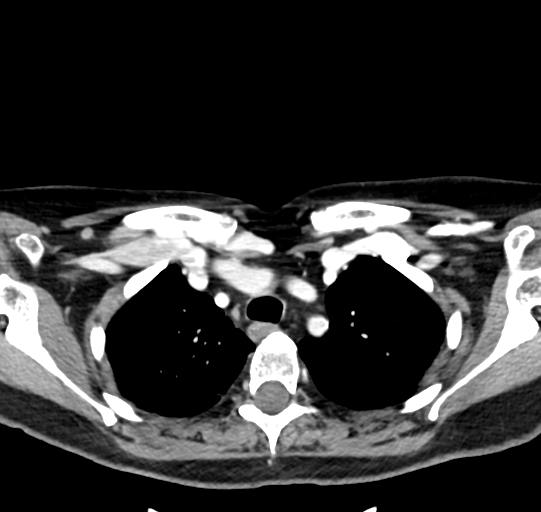
[im 38/150  bone]
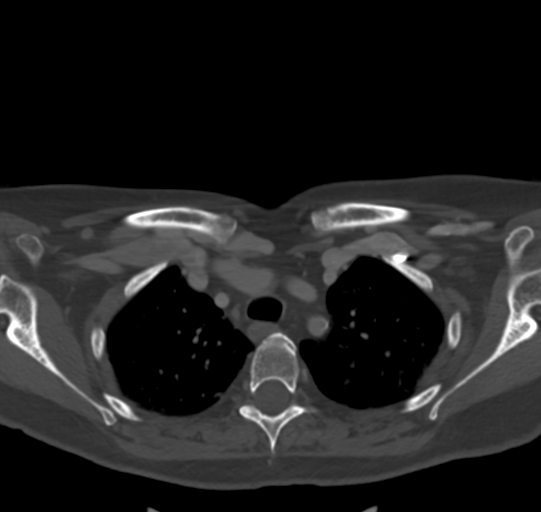
[im 75/150  bone]
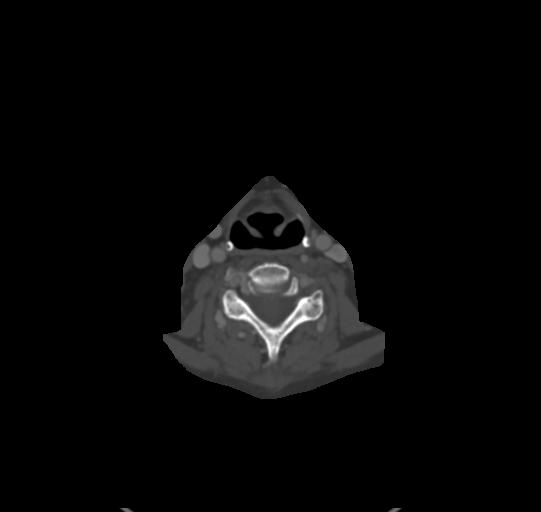
[im 112/150  bone]
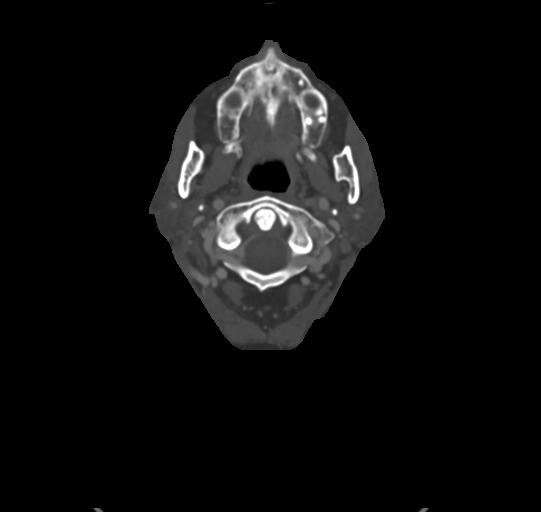

[16 of 33 positions shown; findings below may reference images not displayed]

RADIATION DOSE REDUCTION: This exam was performed according to the
departmental dose-optimization program which includes automated
exposure control, adjustment of the mA and/or kV according to
patient size and/or use of iterative reconstruction technique.

CONTRAST:  75mL OMNIPAQUE IOHEXOL 300 MG/ML  SOLN
FINDINGS: Pharynx and larynx: Normal. No mass or swelling.

Salivary glands: No inflammation, mass, or stone.

Vitamin-E capsule placed overlying the right submandibular gland and
area of palpable concern. No abnormality identified in this region.

Thyroid: Right lobe of the thyroid normal. Left lobe of the thyroid
is missing and may be surgically resected or hypoplastic.

Lymph nodes: No enlarged lymph nodes in the neck.

Vascular: Normal vascular enhancement.

Limited intracranial: Negative

Visualized orbits: No orbital lesion.  Bilateral cataract extraction

Mastoids and visualized paranasal sinuses: Extensive mucosal edema
throughout the paranasal sinuses. Prior sinus surgery. Bony
thickening of the maxillary and ethmoid and sphenoid sinuses
bilaterally. Mastoid and middle ear clear bilaterally.

Skeleton: No acute abnormality.

Upper chest: Apical scarring bilaterally.  No acute abnormality

Other: None
IMPRESSION: No acute abnormality in the neck.  No mass or adenopathy

Absent left lobe of thyroid.

## 2023-10-26 DIAGNOSIS — Z79899 Other long term (current) drug therapy: Secondary | ICD-10-CM | POA: Diagnosis not present

## 2023-10-26 DIAGNOSIS — G8929 Other chronic pain: Secondary | ICD-10-CM | POA: Diagnosis not present

## 2023-10-26 DIAGNOSIS — F411 Generalized anxiety disorder: Secondary | ICD-10-CM | POA: Diagnosis not present

## 2023-10-26 DIAGNOSIS — I2510 Atherosclerotic heart disease of native coronary artery without angina pectoris: Secondary | ICD-10-CM | POA: Diagnosis not present

## 2023-10-26 DIAGNOSIS — R7309 Other abnormal glucose: Secondary | ICD-10-CM | POA: Diagnosis not present

## 2023-10-26 DIAGNOSIS — R519 Headache, unspecified: Secondary | ICD-10-CM | POA: Diagnosis not present

## 2023-10-26 DIAGNOSIS — E782 Mixed hyperlipidemia: Secondary | ICD-10-CM | POA: Diagnosis not present

## 2023-10-26 DIAGNOSIS — M79601 Pain in right arm: Secondary | ICD-10-CM | POA: Diagnosis not present

## 2023-10-26 DIAGNOSIS — I10 Essential (primary) hypertension: Secondary | ICD-10-CM | POA: Diagnosis not present

## 2023-11-15 DIAGNOSIS — H10233 Serous conjunctivitis, except viral, bilateral: Secondary | ICD-10-CM | POA: Diagnosis not present

## 2023-11-15 DIAGNOSIS — H04123 Dry eye syndrome of bilateral lacrimal glands: Secondary | ICD-10-CM | POA: Diagnosis not present

## 2023-11-15 DIAGNOSIS — Z961 Presence of intraocular lens: Secondary | ICD-10-CM | POA: Diagnosis not present

## 2023-11-15 DIAGNOSIS — H43813 Vitreous degeneration, bilateral: Secondary | ICD-10-CM | POA: Diagnosis not present

## 2023-12-05 DIAGNOSIS — M7581 Other shoulder lesions, right shoulder: Secondary | ICD-10-CM | POA: Diagnosis not present

## 2023-12-05 DIAGNOSIS — Z9889 Other specified postprocedural states: Secondary | ICD-10-CM | POA: Diagnosis not present

## 2023-12-07 DIAGNOSIS — G5601 Carpal tunnel syndrome, right upper limb: Secondary | ICD-10-CM | POA: Diagnosis not present

## 2023-12-07 DIAGNOSIS — G629 Polyneuropathy, unspecified: Secondary | ICD-10-CM | POA: Diagnosis not present

## 2023-12-07 DIAGNOSIS — G43709 Chronic migraine without aura, not intractable, without status migrainosus: Secondary | ICD-10-CM | POA: Diagnosis not present

## 2023-12-26 ENCOUNTER — Ambulatory Visit: Admitting: Urology

## 2023-12-26 ENCOUNTER — Other Ambulatory Visit: Payer: Self-pay | Admitting: Internal Medicine

## 2023-12-26 VITALS — BP 147/81 | HR 68 | Wt 155.0 lb

## 2023-12-26 DIAGNOSIS — N393 Stress incontinence (female) (male): Secondary | ICD-10-CM

## 2023-12-26 DIAGNOSIS — N390 Urinary tract infection, site not specified: Secondary | ICD-10-CM

## 2023-12-26 DIAGNOSIS — Z1231 Encounter for screening mammogram for malignant neoplasm of breast: Secondary | ICD-10-CM

## 2023-12-26 LAB — BLADDER SCAN AMB NON-IMAGING

## 2023-12-26 NOTE — Progress Notes (Signed)
 12/26/2023 9:51 AM   Beth Beck 23-Oct-1945 [id number]  Referring provider: Auston Reyes BIRCH, MD [address] [facility] [place],  [place] [address]  Chief Complaint  Patient presents with   Over Active Bladder    HPI: I was consulted to assess the patient's urinary incontinence.  She leaks with coughing sneezing bending lifting.  She goes from a sitting to standing position she can leak without an urge.  She also has urge incontinence.  No bedwetting.  She can be walking and all of a sudden she leaks and cannot stop it.  At home she does not wear a pad but wears 1 in public when she goes out  She voids every 1-2 hours.  She gets up 2-3 times a night.  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  Flow was good  She had a bladder suspension many years ago.  She reports 6 or 7 bladder infections a year with abdominal pain burning that respond favorably and temporarily to antibiotics such as ciprofloxacin  She has had a hysterectomy  No neurologic issues.  Bowel function normal.  No treatment   PMH: Past Medical History:  Diagnosis Date   Anemia    Anxiety    Bradycardia    CAD (coronary artery disease)    Chronic kidney disease    History Of UTI   DDD (degenerative disc disease), cervical    Difficult intubation    Dyspnea    GERD (gastroesophageal reflux disease)    Headache    migraines - none recently, on medicine regimen   Hypercholesteremia    Hypertension    Irritable bowel syndrome (IBS)    Neuromuscular disorder (HCC)    Right arm weakness s/p RCR surg x2   PONV (postoperative nausea and vomiting)    Post herpetic neuralgia    right rib cage. shingles approx 7 yrs ago   Shingles    approx 7 yrs ago - right ribs   Sinus congestion    Sinusitis    Sleep apnea    fixed with UPPP surgery    Surgical History: Past Surgical History:  Procedure Laterality Date   ABDOMINAL HYSTERECTOMY     BREAST BIOPSY  Left 10+yrs ago   neg.   CHOLECYSTECTOMY     COLONOSCOPY WITH PROPOFOL  N/A 03/15/2017   Procedure: COLONOSCOPY WITH PROPOFOL ;  Surgeon: Gaylyn Gladis PENNER, MD;  Location: [facility] ENDOSCOPY;  Service: Endoscopy;  Laterality: N/A;   ETHMOIDECTOMY Bilateral 02/18/2015   Procedure: BILATERAL TOTAL ETHMOIDECTOMY;  Surgeon: Deward Dolly, MD;  Location: [facility] SUR[facility]ERY CNTR;  Service: ENT;  Laterality: Bilateral;   ETHMOIDECTOMY Bilateral 09/21/2022   Procedure: TOTAL ETHMOIDECTOMY;  Surgeon: Dolly Deward, MD;  Location: [facility] SURGERY CNTR;  Service: ENT;  Laterality: Bilateral;   FLEXIBLE BRONCHOSCOPY N/A 04/20/2016   Procedure: FLEXIBLE BRONCHOSCOPY;  Surgeon: Alm KATHEE Nett, MD;  Location: [facility] ORS;  Service: Pulmonary;  Laterality: N/A;   FRONTAL SINUS EXPLORATION Bilateral 02/18/2015   Procedure: BILATERAL FRONTAL RECESS SINUS EXPLORATION;  Surgeon: Deward Dolly, MD;  Location: [facility];  Service: ENT;  Laterality: Bilateral;   FRONTAL SINUS EXPLORATION Bilateral 09/21/2022   Procedure: FRONTAL SINUS EXPLORATION;  Surgeon: Dolly Deward, MD;  Location: [facility];  Service: ENT;  Laterality: Bilateral;   IMAGE GUIDED SINUS SURGERY N/A 02/18/2015   Procedure: IMAGE GUIDED SINUS SURGERY;  Surgeon: Deward Dolly, MD;  Location: [facility]ERY [facility]TR;  Service: ENT;  Laterality: N/A;  GAVE DISK TO CE CE   IMAGE GUIDED SINUS SURGERY Bilateral 09/21/2022   Procedure: IMAGE GUIDED SINUS SURGERY;  Surgeon: Blair Mt, MD;  Location: [facility] SURGERY CNTR;  Service: ENT;  Laterality: Bilateral;   MAXILLARY ANTROSTOMY  02/18/2015   Procedure: MAXILLARY ANTROSTOMY right side with tissue removal.;  Surgeon: Mt Blair, MD;  Location: [facility] SUR[facility]ERY CNTR;  Service: ENT;;   MAXILLARY ANTROSTOMY Bilateral 09/21/2022   Procedure: MAXILLARY ANTROSTOMY WITH TISSUE REMOVAL;  Surgeon: Blair Mt, MD;  Location: [facility] SURGERY CNTR;  Service: ENT;  Laterality: Bilateral;   NASAL SINUS SURGERY   May 23, 2015   Dr. Ronal, [facility] [place]   SHOULDER ARTHROSCOPY Right 07/29/2015   Procedure:  right shoulder arthroscopy, debridement of frayed labrium, excision of retained anchor;  Surgeon: Norleen JINNY Maltos, MD;  Location: [facility] ORS;  Service: Orthopedics;  Laterality: Right;   SHOULDER ARTHROSCOPY WITH OPEN ROTATOR CUFF REPAIR Right 10/05/2016   Procedure: SHOULDER ARTHROSCOPY WITH DEBRIDEMENT WITH ROTATOR CUFF REPAIR AND REMOVAL OF RETAINED FOREIGN BODY.;  Surgeon: Maltos Norleen JINNY, MD;  Location: [facility] ORS;  Service: Orthopedics;  Laterality: Right;   SPHENOIDECTOMY Bilateral 02/18/2015   Procedure: REVISION BILATERAL SPHENOIDECTOMY;  Surgeon: Mt Blair, MD;  Location: [facility] SURGERY CNTR;  Service: ENT;  Laterality: Bilateral;   SPHENOIDECTOMY Bilateral 09/21/2022   Procedure: SPHENOIDECTOMY WITH TISSUE REMOVAL;  Surgeon: Blair Mt, MD;  Location: [facility] SURGERY CNTR;  Service: ENT;  Laterality: Bilateral;   TONSILLECTOMY     TUBAL LIGATION     UVULOPALATOPHARYNGOPLASTY      Home Medications:  Allergies as of 12/26/2023       Reactions   Dymista  [azelastine -fluticasone ] Shortness Of Breath, Palpitations, Other (See Comments)   Dry mouth   Adderall [amphetamine-dextroamphetamine] Swelling   Also, abdominal pain   Amoxicillin Itching   Felt like she had a yeast infection. Has patient had a PCN reaction causing immediate rash, facial/tongue/throat swelling, SOB or lightheadedness with hypotension: No Has patient had a PCN reaction causing severe rash involving mucus membranes or skin necrosis: No Has patient had a PCN reaction that required hospitalization: No Has patient had a PCN reaction occurring within the last 10 years: Yes If all of the above answers are NO, then may proceed with Cephalosporin use.   Bactrim [sulfamethoxazole-trimethoprim] Itching   Clindamycin/lincomycin Nausea And Vomiting   Cymbalta [duloxetine Hcl] Other (See Comments)   bad dreams   Escitalopram  Nausea Only, Other (See Comments)   bad dreams   Meloxicam Itching, Nausea And Vomiting   Naltrexone    Weaknes, twitching and jerking   Pristiq [desvenlafaxine]    Twitching and jerking   Prozac [fluoxetine]    Dizziness, twitching, couldn't sleep   Rosuvastatin    Joint pain   Sulfa Antibiotics Hives, Itching   Telmisartan    Chest pain, tremors   Tetracyclines & Related Hives, Diarrhea        Medication List        Accurate as of December 26, 2023  9:51 AM. If you have any questions, ask your nurse or doctor.          amLODipine 5 MG tablet Commonly known as: NORVASC Take 5 mg by mouth daily.   CALCIUM-MAGNESIUM-ZINC PO Take 1-2 capsules by mouth 2 (two) times daily. 2 in the morning and 1 at night   CENTRUM SILVER PO Take 1 tablet by mouth daily.   multivitamin-lutein Caps capsule Take 1 capsule by mouth daily.   cephALEXin   500 MG capsule Commonly known as: Keflex  Take 1 capsule (500 mg total) by mouth 2 (two) times daily.   esomeprazole 40 MG capsule Commonly known as: NEXIUM Take 40 mg by mouth every morning.   fluticasone  50 MCG/ACT nasal spray Commonly known as: FLONASE Place 2 sprays into both nostrils daily as needed for allergies or rhinitis.   gabapentin 300 MG capsule Commonly known as: NEURONTIN Take 600 mg by mouth 2 (two) times daily.   HYDROcodone -acetaminophen  5-325 MG tablet Commonly known as: NORCO/VICODIN Take 1-2 tablets by mouth every 6 (six) hours as needed for moderate pain.   isosorbide dinitrate 30 MG tablet Commonly known as: ISORDIL Take 30 mg by mouth daily.   lovastatin 40 MG tablet Commonly known as: MEVACOR Take 40 mg by mouth at bedtime.   metoCLOPramide  10 MG tablet Commonly known as: REGLAN  Take 10 mg by mouth daily as needed (migraines).   predniSONE  10 MG tablet Commonly known as: DELTASONE  Take 10 mg by mouth daily with breakfast.   Probiotic Daily Caps Take 1 capsule by mouth daily.   propranolol  80 MG tablet Commonly known as: INDERAL Take 80 mg by mouth at bedtime.   SINUS RINSE BOTTLE KIT NA Place into the nose 2 (two) times daily.   VITAMIN B-12 PO Take 1 tablet by mouth daily.        Allergies:  Allergies  Allergen Reactions   Dymista  [Azelastine -Fluticasone ] Shortness Of Breath, Palpitations and Other (See Comments)    Dry mouth   Adderall [Amphetamine-Dextroamphetamine] Swelling    Also, abdominal pain   Amoxicillin Itching    Felt like she had a yeast infection. Has patient had a PCN reaction causing immediate rash, facial/tongue/throat swelling, SOB or lightheadedness with hypotension: No Has patient had a PCN reaction causing severe rash involving mucus membranes or skin necrosis: No Has patient had a PCN reaction that required hospitalization: No Has patient had a PCN reaction occurring within the last 10 years: Yes If all of the above answers are NO, then may proceed with Cephalosporin use.   Bactrim [Sulfamethoxazole-Trimethoprim] Itching   Clindamycin/Lincomycin Nausea And Vomiting   Cymbalta [Duloxetine Hcl] Other (See Comments)    bad dreams   Escitalopram Nausea Only and Other (See Comments)    bad dreams   Meloxicam Itching and Nausea And Vomiting   Naltrexone     Weaknes, twitching and jerking   Pristiq [Desvenlafaxine]     Twitching and jerking   Prozac [Fluoxetine]     Dizziness, twitching, couldn't sleep   Rosuvastatin     Joint pain   Sulfa Antibiotics Hives and Itching   Telmisartan     Chest pain, tremors   Tetracyclines & Related Hives and Diarrhea    Family History: Family History  Problem Relation Age of Onset   Aneurysm Mother    Heart failure Father    Breast cancer Neg Hx     Social History:  reports that she has never smoked. She has never used smokeless tobacco. She reports that she does not drink alcohol and does not use drugs.  ROS:                                        Physical  Exam: BP (!) 147/81 (BP Location: Left Arm, Patient Position: Sitting, Cuff Size: Normal)   Pulse 68   Wt 70.3 kg   SpO2 96%  BMI 25.79 kg/m   Constitutional:  Alert and oriented, No acute distress. HEENT: [place] AT, moist mucus membranes.  Trachea midline, no masses. Cardiovascular: No clubbing, cyanosis, or edema. Respiratory: Normal respiratory effort, no increased work of breathing. GI: Abdomen is soft, nontender, nondistended, no abdominal masses GU: Mild to moderate grade 2 hypermobility of the bladder neck and negative cough test with modest cough.  No significant prolapse Skin: No rashes, bruises or suspicious lesions. Lymph: No cervical or inguinal adenopathy. Neurologic: Grossly intact, no focal deficits, moving all 4 extremities. Psychiatric: Normal mood and affect.  Laboratory Data: Lab Results  Component Value Date   WBC 5.1 05/08/2015   HGB 12.2 05/08/2015   HCT 37.2 05/08/2015   MCV 85.4 05/08/2015   PLT 224 05/08/2015    No results found for: CREATININE  No results found for: PSA  No results found for: TESTOSTERONE  No results found for: HGBA1C  Urinalysis No results found for: COLORURINE, APPEARANCEUR, LABSPEC, PHURINE, GLUCOSEU, HGBUR, BILIRUBINUR, KETONESUR, PROTEINUR, UROBILINOGEN, NITRITE, LEUKOCYTESUR  Pertinent Imaging:   Assessment & Plan:  Patient did not void and was scanned for over 700 mL.  No recent renal x-ray.  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  I would like to control her UTIs, get a renal ultrasound and recheck a residual urine volume and have her come back for cystoscopy.  In the future she likely will need urodynamics depending on her progress and postvoid residual.  She understands she may need a test in the future without details.  Based on previous yeast infections and  sulfa allergy with itching I thought was reasonable to put her on daily Macrodantin   Patient reported she has had C. difficile when she was on multiple antibiotics for sinus issues.  I thought it was best not to put her on prophylaxis at this stage recognizing Macrodantin likely safe.  I did not have a positive urine cultures in the medical record.  Will proceed accordingly as she comes back for cystoscopy and postvoid residual and ultrasound  1. Stress incontinence, female (Primary)  - Urinalysis, Complete - BLADDER SCAN AMB NON-IMAGING   No follow-ups on file.  Glendia DELENA Elizabeth, MD  [facility] [address] [place], [place] [address] [phone]

## 2023-12-26 NOTE — Patient Instructions (Addendum)
 Scheduling Number: [id number] Cystoscopy Cystoscopy is a procedure that is used to help diagnose and sometimes treat conditions that affect the lower urinary tract. The lower urinary tract includes the bladder and the urethra. The urethra is the tube that drains urine from the bladder. Cystoscopy is done using a thin, tube-shaped instrument with a light and camera at the end (cystoscope). The cystoscope may be hard or flexible, depending on the goal of the procedure. The cystoscope is inserted through the urethra, into the bladder. Cystoscopy may be recommended if you have: Urinary tract infections that keep coming back. Blood in the urine (hematuria). An inability to control when you urinate (urinary incontinence) or an overactive bladder. Unusual cells found in a urine sample. A blockage in the urethra, such as a urinary stone. Painful urination. An abnormality in the bladder found during an intravenous pyelogram (IVP) or CT scan. What are the risks? Generally, this is a safe procedure. However, problems may occur, including: Infection. Bleeding.  What happens during the procedure?  You will be given one or more of the following: A medicine to numb the area (local anesthetic). The area around the opening of your urethra will be cleaned. The cystoscope will be passed through your urethra into your bladder. Germ-free (sterile) fluid will flow through the cystoscope to fill your bladder. The fluid will stretch your bladder so that your health care provider can clearly examine your bladder walls. Your doctor will look at the urethra and bladder. The cystoscope will be removed The procedure may vary among health care providers  What can I expect after the procedure? After the procedure, it is common to have: Some soreness or pain in your urethra. Urinary symptoms. These include: Mild pain or burning when you urinate. Pain should stop within a few minutes after you urinate. This may  last for up to a few days after the procedure. A small amount of blood in your urine for several days. Feeling like you need to urinate but producing only a small amount of urine. Follow these instructions at home: General instructions Return to your normal activities as told by your health care provider.  Drink plenty of fluids after the procedure. Keep all follow-up visits as told by your health care provider. This is important. Contact a health care provider if you: Have pain that gets worse or does not get better with medicine, especially pain when you urinate lasting longer than 72 hours after the procedure. Have trouble urinating. Get help right away if you: Have blood clots in your urine. Have a fever or chills. Are unable to urinate. Summary Cystoscopy is a procedure that is used to help diagnose and sometimes treat conditions that affect the lower urinary tract. Cystoscopy is done using a thin, tube-shaped instrument with a light and camera at the end. After the procedure, it is common to have some soreness or pain in your urethra. It is normal to have blood in your urine after the procedure.  If you were prescribed an antibiotic medicine, take it as told by your health care provider.  This information is not intended to replace advice given to you by your health care provider. Make sure you discuss any questions you have with your health care provider. Document Revised: 04/11/2018 Document Reviewed: 04/11/2018 Elsevier Patient Education  2020 [employer].

## 2024-01-04 ENCOUNTER — Ambulatory Visit
Admission: RE | Admit: 2024-01-04 | Discharge: 2024-01-04 | Disposition: A | Discharge status: Home / Self Care | Source: Ambulatory Visit | Attending: Urology | Admitting: Urology

## 2024-01-04 DIAGNOSIS — N390 Urinary tract infection, site not specified: Secondary | ICD-10-CM | POA: Insufficient documentation

## 2024-01-04 DIAGNOSIS — N200 Calculus of kidney: Secondary | ICD-10-CM | POA: Diagnosis not present

## 2024-01-18 ENCOUNTER — Ambulatory Visit: Admitting: Urology

## 2024-02-02 ENCOUNTER — Ambulatory Visit
Admission: RE | Admit: 2024-02-02 | Discharge: 2024-02-02 | Disposition: A | Discharge status: Home / Self Care | Source: Ambulatory Visit | Attending: Internal Medicine | Admitting: Internal Medicine

## 2024-02-02 DIAGNOSIS — Z1231 Encounter for screening mammogram for malignant neoplasm of breast: Secondary | ICD-10-CM | POA: Diagnosis not present

## 2024-02-17 NOTE — Progress Notes (Addendum)
 Beth Beck                                          MRN: [id number]   02/17/2024   The [facility] Quality Team Specialist reviewed this patient medical record for the purposes of chart review for care gap closure. The following were reviewed: chart review for care gap closure-controlling blood pressure.  Abstracted most recent cbp    [facility] Quality Team

## 2024-02-20 DIAGNOSIS — Z1331 Encounter for screening for depression: Secondary | ICD-10-CM | POA: Diagnosis not present

## 2024-02-20 DIAGNOSIS — E782 Mixed hyperlipidemia: Secondary | ICD-10-CM | POA: Diagnosis not present

## 2024-02-20 DIAGNOSIS — G43E09 Chronic migraine with aura, not intractable, without status migrainosus: Secondary | ICD-10-CM | POA: Diagnosis not present

## 2024-02-20 DIAGNOSIS — I10 Essential (primary) hypertension: Secondary | ICD-10-CM | POA: Diagnosis not present

## 2024-02-20 DIAGNOSIS — Z79899 Other long term (current) drug therapy: Secondary | ICD-10-CM | POA: Diagnosis not present

## 2024-02-20 DIAGNOSIS — Z0000 Encounter for general adult medical examination without abnormal findings: Secondary | ICD-10-CM | POA: Diagnosis not present

## 2024-02-20 DIAGNOSIS — R7309 Other abnormal glucose: Secondary | ICD-10-CM | POA: Diagnosis not present

## 2024-02-20 DIAGNOSIS — F411 Generalized anxiety disorder: Secondary | ICD-10-CM | POA: Diagnosis not present

## 2024-02-23 DIAGNOSIS — J324 Chronic pansinusitis: Secondary | ICD-10-CM | POA: Diagnosis not present

## 2024-02-23 DIAGNOSIS — J320 Chronic maxillary sinusitis: Secondary | ICD-10-CM | POA: Diagnosis not present

## 2024-02-27 DIAGNOSIS — R7309 Other abnormal glucose: Secondary | ICD-10-CM | POA: Diagnosis not present

## 2024-02-27 DIAGNOSIS — E782 Mixed hyperlipidemia: Secondary | ICD-10-CM | POA: Diagnosis not present

## 2024-02-27 DIAGNOSIS — I10 Essential (primary) hypertension: Secondary | ICD-10-CM | POA: Diagnosis not present

## 2024-02-27 DIAGNOSIS — Z79899 Other long term (current) drug therapy: Secondary | ICD-10-CM | POA: Diagnosis not present

## 2024-02-28 DIAGNOSIS — R7309 Other abnormal glucose: Secondary | ICD-10-CM | POA: Diagnosis not present

## 2024-02-28 DIAGNOSIS — E782 Mixed hyperlipidemia: Secondary | ICD-10-CM | POA: Diagnosis not present

## 2024-02-28 DIAGNOSIS — I10 Essential (primary) hypertension: Secondary | ICD-10-CM | POA: Diagnosis not present

## 2024-02-28 DIAGNOSIS — R8290 Unspecified abnormal findings in urine: Secondary | ICD-10-CM | POA: Diagnosis not present

## 2024-02-28 DIAGNOSIS — Z79899 Other long term (current) drug therapy: Secondary | ICD-10-CM | POA: Diagnosis not present

## 2024-03-05 ENCOUNTER — Ambulatory Visit: Admitting: Urology

## 2024-03-26 ENCOUNTER — Ambulatory Visit: Admitting: Urology

## 2024-03-26 VITALS — BP 125/72 | HR 50 | Ht 66.0 in | Wt 157.0 lb

## 2024-03-26 DIAGNOSIS — N3946 Mixed incontinence: Secondary | ICD-10-CM

## 2024-03-26 DIAGNOSIS — N393 Stress incontinence (female) (male): Secondary | ICD-10-CM | POA: Diagnosis not present

## 2024-03-26 DIAGNOSIS — N390 Urinary tract infection, site not specified: Secondary | ICD-10-CM

## 2024-03-26 LAB — URINALYSIS, COMPLETE
Bilirubin, UA: NEGATIVE
Glucose, UA: NEGATIVE
Ketones, UA: NEGATIVE
Leukocytes,UA: NEGATIVE
Nitrite, UA: NEGATIVE
Protein,UA: NEGATIVE
RBC, UA: NEGATIVE
Specific Gravity, UA: 1.01 (ref 1.005–1.030)
Urobilinogen, Ur: 0.2 mg/dL (ref 0.2–1.0)
pH, UA: 6 (ref 5.0–7.5)

## 2024-03-26 LAB — MICROSCOPIC EXAMINATION: Bacteria, UA: NONE SEEN

## 2024-03-26 MED ORDER — GEMTESA 75 MG PO TABS: 75.0000 mg | ORAL_TABLET | Freq: Every day | ORAL | Status: DC

## 2024-03-26 NOTE — Progress Notes (Signed)
 03/26/2024 9:38 AM   Beth Beck 01-01-1946 [id number]  Referring provider: Auston Reyes BIRCH, MD [address] [facility] [place],  [place] [address]  Chief Complaint  Patient presents with   Cysto    HPI: I was consulted to assess the patient's urinary incontinence.  She leaks with coughing sneezing bending lifting.  She goes from a sitting to standing position she can leak without an urge.  She also has urge incontinence.  No bedwetting.  She can be walking and all of a sudden she leaks and cannot stop it.  At home she does not wear a pad but wears 1 in public when she goes out   She voids every 1-2 hours.  She gets up 2-3 times a night.  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  Flow was good   She had a bladder suspension many years ago.  She reports 6 or 7 bladder infections a year with abdominal pain burning that respond favorably and temporarily to antibiotics such as ciprofloxacin   She has had a hysterectomy    Patient did not void and was scanned for over 700 mL.  No recent renal x-ray.  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  I would like to control her UTIs, get a renal ultrasound and recheck a residual urine volume and have her come back for cystoscopy.  In the future she likely will need urodynamics depending on her progress and postvoid residual.  She understands she may need a test in the future without details.  Based on previous yeast infections and sulfa allergy with itching I thought was reasonable to put her on daily Macrodantin    Patient reported she has had C. difficile when she was on multiple antibiotics for sinus issues.  I thought it was best not to put her on prophylaxis at this stage recognizing Macrodantin likely safe.  I did not have a positive urine cultures in the  medical record.  Will proceed accordingly as she comes back for cystoscopy and postvoid residual and ultrasound  Today Patient had a renal ultrasound January 04, 2024.  She has a 5 mm nonobstructing stone lower pole left kidney.  It appears that she had C. difficile in 2018  Incontinence stable.  Clinically not infected  On pelvic examination patient had mild grade 2 hypermobility of the bladder neck and no stress incontinence with light cough.  No prolapse.  She did have some vaginal dryness  Patient underwent flexible cystoscopy.  Bladder mucosa and trigone were normal.  No cystitis.  No carcinoma.  Urine sent for culture.  I filled her up so she could do a void with a postvoid residual  Even after double voiding patient had a residual of 815 mL   PMH: Past Medical History:  Diagnosis Date   Anemia    Anxiety    Bradycardia    CAD (coronary artery disease)    Chronic kidney disease    History Of UTI   DDD (degenerative disc disease), cervical    Difficult intubation    Dyspnea    GERD (gastroesophageal reflux disease)    Headache    migraines - none recently, on medicine regimen   Hypercholesteremia    Hypertension    Irritable bowel syndrome (IBS)  Neuromuscular disorder (HCC)    Right arm weakness s/p RCR surg x2   PONV (postoperative nausea and vomiting)    Post herpetic neuralgia    right rib cage. shingles approx 7 yrs ago   Shingles    approx 7 yrs ago - right ribs   Sinus congestion    Sinusitis    Sleep apnea    fixed with UPPP surgery    Surgical History: Past Surgical History:  Procedure Laterality Date   ABDOMINAL HYSTERECTOMY     BREAST BIOPSY Left 10+yrs ago   neg.   CHOLECYSTECTOMY     COLONOSCOPY WITH PROPOFOL  N/A 03/15/2017   Procedure: COLONOSCOPY WITH PROPOFOL ;  Surgeon: Gaylyn Gladis PENNER, MD;  Location: [facility] ENDOSCOPY;  Service: Endoscopy;  Laterality: N/A;   ETHMOIDECTOMY Bilateral 02/18/2015   Procedure: BILATERAL TOTAL ETHMOIDECTOMY;   Surgeon: Deward Dolly, MD;  Location: [facility] SURGERY CNTR;  Service: ENT;  Laterality: Bilateral;   ETHMOIDECTOMY Bilateral 09/21/2022   Procedure: TOTAL ETHMOIDECTOMY;  Surgeon: Dolly Deward, MD;  Location: [facility];  Service: ENT;  Laterality: Bilateral;   FLEXIBLE BRONCHOSCOPY N/A 04/20/2016   Procedure: FLEXIBLE BRONCHOSCOPY;  Surgeon: Alm KATHEE Nett, MD;  Location: [facility] ORS;  Service: Pulmonary;  Laterality: N/A;   FRONTAL SINUS EXPLORATION Bilateral 02/18/2015   Procedure: BILATERAL FRONTAL RECESS SINUS EXPLORATION;  Surgeon: Deward Dolly, MD;  Location: [facility];  Service: ENT;  Laterality: Bilateral;   FRONTAL SINUS EXPLORATION Bilateral 09/21/2022   Procedure: FRONTAL SINUS EXPLORATION;  Surgeon: Dolly Deward, MD;  Location: [facility];  Service: ENT;  Laterality: Bilateral;   IMAGE GUIDED SINUS SURGERY N/A 02/18/2015   Procedure: IMAGE GUIDED SINUS SURGERY;  Surgeon: Deward Dolly, MD;  Location: [facility];  Service: ENT;  Laterality: N/A;  GAVE DISK TO CE CE   IMAGE GUIDED SINUS SURGERY Bilateral 09/21/2022   Procedure: IMAGE GUIDED SINUS SURGERY;  Surgeon: Dolly Deward, MD;  Location: [facility];  Service: ENT;  Laterality: Bilateral;   MAXILLARY ANTROSTOMY  02/18/2015   Procedure: MAXILLARY ANTROSTOMY right side with tissue removal.;  Surgeon: Deward Dolly, MD;  Location: [facility];  Service: ENT;;   MAXILLARY ANTROSTOMY Bilateral 09/21/2022   Procedure: MAXILLARY ANTROSTOMY WITH TISSUE REMOVAL;  Surgeon: Dolly Deward, MD;  Location: [facility];  Service: ENT;  Laterality: Bilateral;   NASAL SINUS SURGERY  May 23, 2015   Dr. Ronal, [facility] [place]   SHOULDER ARTHROSCOPY Right 07/29/2015   Procedure:  right shoulder arthroscopy, debridement of frayed labrium, excision of retained anchor;  Surgeon: Norleen JINNY Maltos, MD;  Location: [facility] ORS;  Service: Orthopedics;  Laterality: Right;   SHOULDER ARTHROSCOPY WITH OPEN  ROTATOR CUFF REPAIR Right 10/05/2016   Procedure: SHOULDER ARTHROSCOPY WITH DEBRIDEMENT WITH ROTATOR CUFF REPAIR AND REMOVAL OF RETAINED FOREIGN BODY.;  Surgeon: Maltos Norleen JINNY, MD;  Location: [facility] ORS;  Service: Orthopedics;  Laterality: Right;   SPHENOIDECTOMY Bilateral 02/18/2015   Procedure: REVISION BILATERAL SPHENOIDECTOMY;  Surgeon: Deward Dolly, MD;  Location: [facility] SUR[facility]ERY [facility];  Service: ENT;  Laterality: Bilateral;   SPHENOIDECTOMY Bilateral 09/21/2022   Procedure: SPHENOIDECTOMY WITH TISSUE REMOVAL;  Surgeon: Dolly Deward, MD;  Location: [facility];  Service: ENT;  Laterality: Bilateral;   TONSILLECTOMY     TUBAL LIGATION     UVULOPALATOPHARYNGOPLASTY      Home Medications:  Allergies as of 03/26/2024       Reactions   Dymista  [azelastine -fluticasone ] Shortness Of Breath, Palpitations, Other (See Comments)   Dry  mouth   Adderall [amphetamine-dextroamphetamine] Swelling   Also, abdominal pain   Amoxicillin Itching   Felt like she had a yeast infection. Has patient had a PCN reaction causing immediate rash, facial/tongue/throat swelling, SOB or lightheadedness with hypotension: No Has patient had a PCN reaction causing severe rash involving mucus membranes or skin necrosis: No Has patient had a PCN reaction that required hospitalization: No Has patient had a PCN reaction occurring within the last 10 years: Yes If all of the above answers are NO, then may proceed with Cephalosporin use.   Bactrim [sulfamethoxazole-trimethoprim] Itching   Clindamycin/lincomycin Nausea And Vomiting   Cymbalta [duloxetine Hcl] Other (See Comments)   bad dreams   Duloxetine Other (See Comments)   bad dreams   Escitalopram Nausea Only, Other (See Comments)   bad dreams   Meloxicam Itching, Nausea And Vomiting   Naltrexone    Weaknes, twitching and jerking   Phentermine Other (See Comments)   Body jerking   Pristiq [desvenlafaxine]    Twitching and jerking   Prozac  [fluoxetine]    Dizziness, twitching, couldn't sleep   Rosuvastatin    Joint pain   Solifenacin Other (See Comments)   Dry mouth, sore throat   Sulfa Antibiotics Hives, Itching   Telmisartan    Chest pain, tremors   Tetracyclines & Related Diarrhea, Hives        Medication List        Accurate as of March 26, 2024  9:38 AM. If you have any questions, ask your nurse or doctor.          amLODipine 5 MG tablet Commonly known as: NORVASC Take 5 mg by mouth daily.   CALCIUM-MAGNESIUM-ZINC PO Take 1-2 capsules by mouth 2 (two) times daily. 2 in the morning and 1 at night   CENTRUM SILVER PO Take 1 tablet by mouth daily.   multivitamin-lutein Caps capsule Take 1 capsule by mouth daily.   cephALEXin  500 MG capsule Commonly known as: Keflex  Take 1 capsule (500 mg total) by mouth 2 (two) times daily.   esomeprazole 40 MG capsule Commonly known as: NEXIUM Take 40 mg by mouth every morning.   fluticasone  50 MCG/ACT nasal spray Commonly known as: FLONASE Place 2 sprays into both nostrils daily as needed for allergies or rhinitis.   gabapentin 300 MG capsule Commonly known as: NEURONTIN Take 600 mg by mouth 2 (two) times daily.   HYDROcodone -acetaminophen  5-325 MG tablet Commonly known as: NORCO/VICODIN Take 1-2 tablets by mouth every 6 (six) hours as needed for moderate pain.   isosorbide dinitrate 30 MG tablet Commonly known as: ISORDIL Take 30 mg by mouth daily.   lovastatin 40 MG tablet Commonly known as: MEVACOR Take 40 mg by mouth at bedtime.   metoCLOPramide  10 MG tablet Commonly known as: REGLAN  Take 10 mg by mouth daily as needed (migraines).   predniSONE  10 MG tablet Commonly known as: DELTASONE  Take 10 mg by mouth daily with breakfast.   Probiotic Daily Caps Take 1 capsule by mouth daily.   propranolol 80 MG tablet Commonly known as: INDERAL Take 80 mg by mouth at bedtime.   SINUS RINSE BOTTLE KIT NA Place into the nose 2 (two) times  daily.   VITAMIN B-12 PO Take 1 tablet by mouth daily.        Allergies:  Allergies  Allergen Reactions   Dymista  [Azelastine -Fluticasone ] Shortness Of Breath, Palpitations and Other (See Comments)    Dry mouth   Adderall [Amphetamine-Dextroamphetamine] Swelling  Also, abdominal pain   Amoxicillin Itching    Felt like she had a yeast infection. Has patient had a PCN reaction causing immediate rash, facial/tongue/throat swelling, SOB or lightheadedness with hypotension: No Has patient had a PCN reaction causing severe rash involving mucus membranes or skin necrosis: No Has patient had a PCN reaction that required hospitalization: No Has patient had a PCN reaction occurring within the last 10 years: Yes If all of the above answers are NO, then may proceed with Cephalosporin use.   Bactrim [Sulfamethoxazole-Trimethoprim] Itching   Clindamycin/Lincomycin Nausea And Vomiting   Cymbalta [Duloxetine Hcl] Other (See Comments)    bad dreams   Duloxetine Other (See Comments)    bad dreams   Escitalopram Nausea Only and Other (See Comments)    bad dreams   Meloxicam Itching and Nausea And Vomiting   Naltrexone     Weaknes, twitching and jerking   Phentermine Other (See Comments)    Body jerking   Pristiq [Desvenlafaxine]     Twitching and jerking   Prozac [Fluoxetine]     Dizziness, twitching, couldn't sleep   Rosuvastatin     Joint pain   Solifenacin Other (See Comments)    Dry mouth, sore throat   Sulfa Antibiotics Hives and Itching   Telmisartan     Chest pain, tremors   Tetracyclines & Related Diarrhea and Hives    Family History: Family History  Problem Relation Age of Onset   Aneurysm Mother    Heart failure Father    Breast cancer Neg Hx     Social History:  reports that she has never smoked. She has never used smokeless tobacco. She reports that she does not drink alcohol and does not use drugs.  ROS:                                         Physical Exam: There were no vitals taken for this visit.  Constitutional:  Alert and oriented, No acute distress. HEENT: [place] AT, moist mucus membranes.  Trachea midline, no masses. Laboratory Data: Lab Results  Component Value Date   WBC 5.1 05/08/2015   HGB 12.2 05/08/2015   HCT 37.2 05/08/2015   MCV 85.4 05/08/2015   PLT 224 05/08/2015    No results found for: CREATININE  No results found for: PSA  No results found for: TESTOSTERONE  No results found for: HGBA1C  Urinalysis No results found for: COLORURINE, APPEARANCEUR, LABSPEC, PHURINE, GLUCOSEU, HGBUR, BILIRUBINUR, KETONESUR, PROTEINUR, UROBILINOGEN, NITRITE, LEUKOCYTESUR  Pertinent Imaging:   Assessment & Plan: Patient will be scheduled for urodynamics for mild incontinence and elevated residual.  She has no hydronephrosis.  Patient did not want urodynamics in spite of the explanation.  I think this is very reasonable.  Certainly she will not be treated surgically.  I will see her back on Gemtesa  samples and prescription with a postvoid residual in 6 weeks.  I will keep an eye on her upper tracts and postvoid residual  1. Urinary tract infection without hematuria, site unspecified (Primary)  - Urinalysis, Complete - BLADDER SCAN AMB NON-IMAGING  2. Stress incontinence, female  - Urinalysis, Complete - BLADDER SCAN AMB NON-IMAGING   No follow-ups on file.  Beth DELENA Elizabeth, MD  [facility] [address], [address] [place], [place] [address] [phone]

## 2024-03-26 NOTE — Addendum Note (Signed)
 Addended byBETHA CORIE PLATER on: 03/26/2024 10:23 AM   Modules accepted: Orders

## 2024-04-24 ENCOUNTER — Other Ambulatory Visit: Payer: Self-pay | Admitting: *Deleted

## 2024-04-24 ENCOUNTER — Encounter: Payer: Self-pay | Admitting: Urology

## 2024-04-24 DIAGNOSIS — N393 Stress incontinence (female) (male): Secondary | ICD-10-CM

## 2024-04-24 DIAGNOSIS — N3946 Mixed incontinence: Secondary | ICD-10-CM

## 2024-04-24 MED ORDER — GEMTESA 75 MG PO TABS: 75.0000 mg | ORAL_TABLET | Freq: Every day | ORAL | 11 refills | Status: DC

## 2024-05-01 NOTE — Telephone Encounter (Signed)
 Pt states Gemtesa  is over &500 even with insurance. Pt has not tried any other [facility] medications, please advise.

## 2024-05-04 MED ORDER — OXYBUTYNIN CHLORIDE ER 10 MG PO TB24: 10.0000 mg | ORAL_TABLET | Freq: Every day | ORAL | 3 refills | Status: DC

## 2024-05-04 NOTE — Addendum Note (Signed)
 Addended byBETHA CORIE PLATER on: 05/04/2024 10:35 AM   Modules accepted: Orders

## 2024-05-07 MED ORDER — OXYBUTYNIN CHLORIDE ER 10 MG PO TB24: 10.0000 mg | ORAL_TABLET | Freq: Every day | ORAL | 3 refills | Status: AC

## 2024-05-07 NOTE — Addendum Note (Signed)
 Addended byBETHA CORIE PLATER on: 05/07/2024 01:03 PM   Modules accepted: Orders

## 2024-05-14 ENCOUNTER — Ambulatory Visit: Admitting: Urology

## 2024-05-21 NOTE — Telephone Encounter (Signed)
 Placed a call to inform the patient that the oxybutynin  prescription should be ready today. The patient was advised that because the prescription was initially sent to two different pharmacies, the [facility] prescription had been scheduled for March. I contacted [facility] to confirm that the prescription with them was canceled. I also confirmed with [facility] that the patients medication will be ready today.

## 2024-06-04 ENCOUNTER — Ambulatory Visit: Admitting: Urology

## 2024-06-25 ENCOUNTER — Ambulatory Visit: Admitting: Urology
